# Patient Record
Sex: Female | Born: 1970 | Race: White | Hispanic: No | Smoking: Never smoker
Health system: Southern US, Community
[De-identification: ages and names within clinical notes are randomized; demographics above are authoritative.]

## PROBLEM LIST (undated history)

## (undated) DIAGNOSIS — G43901 Migraine, unspecified, not intractable, with status migrainosus: Secondary | ICD-10-CM

## (undated) DIAGNOSIS — E785 Hyperlipidemia, unspecified: Secondary | ICD-10-CM

## (undated) DIAGNOSIS — G43909 Migraine, unspecified, not intractable, without status migrainosus: Secondary | ICD-10-CM

## (undated) DIAGNOSIS — K219 Gastro-esophageal reflux disease without esophagitis: Secondary | ICD-10-CM

## (undated) DIAGNOSIS — R7989 Other specified abnormal findings of blood chemistry: Secondary | ICD-10-CM

## (undated) DIAGNOSIS — T7840XA Allergy, unspecified, initial encounter: Secondary | ICD-10-CM

## (undated) DIAGNOSIS — J45909 Unspecified asthma, uncomplicated: Secondary | ICD-10-CM

## (undated) DIAGNOSIS — R011 Cardiac murmur, unspecified: Secondary | ICD-10-CM

## (undated) DIAGNOSIS — G930 Cerebral cysts: Secondary | ICD-10-CM

## (undated) DIAGNOSIS — I10 Essential (primary) hypertension: Secondary | ICD-10-CM

## (undated) HISTORY — DX: Gastro-esophageal reflux disease without esophagitis: K21.9

## (undated) HISTORY — DX: Cerebral cysts: G93.0

## (undated) HISTORY — DX: Hyperlipidemia, unspecified: E78.5

## (undated) HISTORY — DX: Migraine, unspecified, not intractable, with status migrainosus: G43.901

## (undated) HISTORY — PX: WISDOM TOOTH EXTRACTION: SHX21

## (undated) HISTORY — DX: Cardiac murmur, unspecified: R01.1

## (undated) HISTORY — DX: Migraine, unspecified, not intractable, without status migrainosus: G43.909

## (undated) HISTORY — DX: Allergy, unspecified, initial encounter: T78.40XA

## (undated) HISTORY — PX: OTHER SURGICAL HISTORY: SHX169

## (undated) HISTORY — DX: Essential (primary) hypertension: I10

---

## 2005-03-08 ENCOUNTER — Other Ambulatory Visit: Admission: RE | Admit: 2005-03-08 | Discharge: 2005-03-08 | Payer: Self-pay | Admitting: Obstetrics and Gynecology

## 2007-11-07 ENCOUNTER — Ambulatory Visit: Payer: Self-pay | Admitting: Obstetrics & Gynecology

## 2007-11-07 ENCOUNTER — Inpatient Hospital Stay (HOSPITAL_COMMUNITY): Admission: AD | Admit: 2007-11-07 | Discharge: 2007-11-07 | Payer: Self-pay | Admitting: Obstetrics and Gynecology

## 2007-11-14 ENCOUNTER — Ambulatory Visit: Payer: Self-pay | Admitting: Obstetrics & Gynecology

## 2007-11-21 ENCOUNTER — Ambulatory Visit: Payer: Self-pay | Admitting: Obstetrics and Gynecology

## 2007-11-28 ENCOUNTER — Ambulatory Visit: Payer: Self-pay | Admitting: Gynecology

## 2007-12-05 ENCOUNTER — Ambulatory Visit: Payer: Self-pay | Admitting: Obstetrics & Gynecology

## 2007-12-11 ENCOUNTER — Ambulatory Visit: Payer: Self-pay | Admitting: Family Medicine

## 2007-12-13 ENCOUNTER — Encounter (INDEPENDENT_AMBULATORY_CARE_PROVIDER_SITE_OTHER): Payer: Self-pay | Admitting: Obstetrics and Gynecology

## 2007-12-13 ENCOUNTER — Inpatient Hospital Stay (HOSPITAL_COMMUNITY): Admission: AD | Admit: 2007-12-13 | Discharge: 2007-12-16 | Payer: Self-pay | Admitting: Obstetrics and Gynecology

## 2011-03-01 NOTE — Op Note (Signed)
[name], [name]               ACCOUNT NO.:  [id number]   MEDICAL RECORD NO.:  [id number]          PATIENT TYPE:  INP   LOCATION:  9122                          FACILITY:  [facility]   PHYSICIAN:  Guy Sandifer. Henderson Cloud, M.D. DATE OF BIRTH:  03-Feb-1971   DATE OF PROCEDURE:  12/13/2007  DATE OF DISCHARGE:                               OPERATIVE REPORT   PREOPERATIVE DIAGNOSIS:  1. Twin intrauterine gestation at the 36 and 6/7 weeks.  2. Arrest of cervical dilation.   POSTOPERATIVE DIAGNOSIS:  1. Twin intrauterine gestation at the 33 and 6/7 weeks.  2. Arrest of cervical dilation.   PROCEDURE:  Low transverse cesarean section.   SURGEON:  Guy Sandifer. Henderson Cloud, M.D.   ANESTHESIA:  Epidural, Leilani Able, M.D.   SPECIMENS:  Placenta to pathology.   ESTIMATED BLOOD LOSS:  900 mL.   FINDINGS:  Baby A viable female infant, Apgars of 8/9 at 1 and 5 minutes  respectively.  Birth weight 5 pounds 6 ounces.  Arterial cord pH 7.30.  Baby B viable female infant, Apgars of 9 and 9 at 1 and 5 minutes  respectively.  Birth weight 6 pounds 3 ounces.  Arterial cord pH 7.34.   INDICATIONS AND CONSENT:  The patient is a 40 year old white female G1,  P0, at 73 and 6/7 weeks estimated gestational age.  Her pregnancy has  been complicated by twins.  Ultrasound on December 06, 2007, revealed  both babies to be vertex.  The patient complains of uterine  contractions.  The cervix was changing to examination in the office.  She denied rupture of membranes, central nervous system changes, or  epigastric pain.  She is admitted to hospital.  Her cervix was 3 cm  dilated, 90% effaced, -1 to -2 station.  Both heartbeats were reactive.  Artificial rupture of membranes for clear fluid is carried out.  The  patient was found to have platelets of 110,000 on admission.  The  remainder of PIH panel was obtained and was within normal limits.  Repeat CBC revealed platelets of 120,000.  Pitocin augmentation was  started.   After 3-4 hours of good uterine contractions, the cervix was  without change.  A diagnosis of arrest of dilation was made.  The  recommendation for cesarean section is made.  The potential risks and  complications are discussed with the patient and father of the baby  preoperatively including but limited to infection, organ damage,  bleeding and transfusion, and DVT.  All questions were answered and  consent is signed on the chart.   PROCEDURE IN DETAIL:  The patient is taken to the operating room where  she is identified.  Her epidural is augmented to surgical level.  She is  placed in a dorsal supine position with a 15 degrees left lateral wedge.  A Foley catheter was already in place.  She was prepped and draped in a  sterile fashion.  After testing for adequate epidural anesthesia, the  skin is entered through a Pfannenstiel incision and dissection is  carried out in layers to the peritoneum which was incised and extended  superiorly and inferiorly.  The vesicouterine peritoneum was taken down  cephalolaterally.  The bladder flap was developed and a bladder blade  was placed.  The uterus is incised in a low transverse manner and the  uterine cavity is entered bluntly with a hemostat.  The uterine incision  extended cephalolaterally with the fingers.  The vertex of baby A is  then delivered without difficulty.  The oral and nasopharynx were  suctioned.  The remainder of the baby is delivered.  Good cry and tone  is noted.  The cord is clamped and cut and the baby is handed to the  awaiting pediatrics team.  Artificial rupture of membranes for baby B is  carried out for clear fluid.  The vertex is then delivered without  difficulty.  Oral and nasopharynx were suctioned.  The remainder of the  baby is delivered and good cry and tone is noted.  The cord is clamped  and cut and the baby is handed to the awaiting pediatrics team.  The  cord for baby A is marked with a cord clamp.  The  placentae are manually  delivered and sent to pathology.  The uterine cavity is cleaned.  The  uterus is closed in two running locking imbricating layers of 0 Monocryl  suture.  There is a 3 cm intramural fibroid in the left lower uterine  segment lateral to the left angle of the uterine incision.  Good  hemostasis is obtained.  The tubes and ovaries were normal bilaterally.  The anterior peritoneum was closed in running fashion with 0 Monocryl  suture which was also used to reapproximate the pyramidalis muscle in  the midline.  The anterior rectus fascia is closed in a running fashion  with 0 PDS suture and the skin is closed with clips.  All sponge,  instrument, and needle counts were correct.  The patient was transferred  to the recovery room in stable condition.      Guy Sandifer Henderson Cloud, M.D.  Electronically Signed     JET/MEDQ  D:  12/14/2007  T:  12/14/2007  Job:  [id number]

## 2011-03-04 NOTE — Discharge Summary (Signed)
[name], [name]               ACCOUNT NO.:  [id number]   MEDICAL RECORD NO.:  [id number]           PATIENT TYPE:   LOCATION:                                 FACILITY:   PHYSICIAN:  Duke Salvia. Marcelle Overlie, M.D.DATE OF BIRTH:  [birth year]   DATE OF ADMISSION:  12/13/2007  DATE OF DISCHARGE:  12/16/2007                               DISCHARGE SUMMARY   ADMITTING DIAGNOSES:  1. Intrauterine pregnancy at 36-6/7 weeks, estimated gestational age.  2. Twin gestation, very spontaneous onset of labor.   DISCHARGE DIAGNOSES:  1. Status post low-transverse cesarean section.  2. Viable female and female infant.   PROCEDURE:  Primary low-transverse cesarean section.   REASON FOR ADMISSION:  Please see written H&P.   HOSPITAL COURSE:  The patient is a 40 year old white female,  primigravida that was admitted to [facility] at 36-6/7  weeks estimated gestational age.  Her pregnancy had been complicated by  twin gestation.  The patient had had an ultrasound, and both babies were  in the vertex presentation.  On admission, the patient had had  spontaneous onset of labor and cervix was seemed to be changing.  She  denied rupture of membranes, headache, blurred vision, or right upper  quadrant pain.  The patient had been noted to be 3 cm dilated, 90%  effaced with vertex at -1 and -2 station.  Fetal heart tones were  reactive.  On admission, laboratory findings revealed platelet count of  110,000.  PIH labs were drawn, which were within normal limits.  CBC was  repeated and platelets were up to 120,000.  Artificial rupture of  membranes was performed, which revealed clear fluid.  Pitocin  augmentation was started after 3 to 4 hours of good contractions.  Cervix was without change.  Decision was made to proceed with a primary  low-transverse cesarean section.  The patient was then transferred to  the operating room, where epidural was dosed to an adequate surgical  level.  A  low-transverse incision was made; delivery of baby A, a viable  female infant, weighing 5 pounds 6 ounces with Apgars of 8 at 1 minute  and 9 at 5 minutes.  Arterial cord pH of 7.30.  Baby B, a female infant,  weighing 6 pounds 3 ounces was delivered with Apgars of 9 at 1 minute  and 9 at 5 minutes.  Arterial cord pH of 7.34.  The patient tolerated  procedure well and was taken to the recovery room in stable condition.  On postoperative day #1, the patient denied headache, blurred vision, or  right upper quadrant pain.  Vital signs were stable with blood pressure  142/85 to 142/87.  Deep tendon reflexes were 1+.  No clonus.  No pitting  edema was noted.  Abdomen was soft.  Fundus firm and nontender.  Abdominal dressing was noted to be clean, dry, and intact.  Foley was  noted to have adequate amount of urine output.   Laboratory findings revealed hemoglobin of 9.1, platelet count of  91,000, and WBC count of 13.1.  Liver function tests were AST was  25,  ALT was 11, alkaline phosphatase was 241, and uric acid was 7.5.  PIH  labs were ordered for later in the afternoon, the patient was thus  started on a regular diet and Motrin was discontinued.   On postoperative day #2, the patient was without complaint.  Vital signs  were stable.  Blood pressure was 119/70.  Temperature 98.9.  Incision  was clean, dry, and intact.  Repeat laboratories revealed hemoglobin of  8.9, platelet count of 89,000, WBC count of 12,000.  On postoperative  day #3, the patient was without complaint.  Vital signs remained stable.  She is afebrile.  Fundus firm and nontender.  Incision was clean, dry,  and intact.  Staples were removed.  Repeat laboratory findings revealed  hemoglobin of 9.2 and  platelet count was up to 113,000.   Discharge instructions were reviewed, and the patient was later  discharged to home.   CONDITION ON DISCHARGE:  Stable.   DIET:  Regular as tolerated.   ACTIVITY:  No heavy lifting, no  driving x2 weeks, and no vaginal entry.   FOLLOW UP:  The patient to follow up in the office in 2 to 3 days for an  incision check, CBC, and blood pressure check.  She is to call for  temperature greater than 100 degrees, persistent nausea, vomiting, heavy  vaginal bleeding, and redness or drainage from incisional site.  The  patient was also instructed call for headache, blurred vision, or right  upper quadrant pain.   DISCHARGE MEDICATIONS:  1. Percocet 5/325, #30, one p.o. 4 to 6 hours.  2. Motrin 600 mg every 6 hours.  3. Prenatal vitamins 1 p.o. daily.  4. Colace 1 p.o. daily.      Julio Sicks, N.P.      Richard M. Marcelle Overlie, M.D.  Electronically Signed    CC/MEDQ  D:  02/04/2008  T:  02/05/2008  Job:  [id number]

## 2011-07-07 LAB — URINALYSIS, ROUTINE W REFLEX MICROSCOPIC
Nitrite: NEGATIVE
Protein, ur: NEGATIVE
Specific Gravity, Urine: 1.015
Urobilinogen, UA: 0.2

## 2011-07-08 LAB — CBC
HCT: 26.3 — ABNORMAL LOW
HCT: 26.7 — ABNORMAL LOW
Hemoglobin: 11.7 — ABNORMAL LOW
Hemoglobin: 8.9 — ABNORMAL LOW
Hemoglobin: 9.3 — ABNORMAL LOW
Hemoglobin: 9.4 — ABNORMAL LOW
MCHC: 34.7
MCV: 92.9
Platelets: 110 — ABNORMAL LOW
RBC: 2.68 — ABNORMAL LOW
RBC: 3.53 — ABNORMAL LOW
RDW: 12.4
RDW: 12.7
RDW: 13
WBC: 11.4 — ABNORMAL HIGH
WBC: 13.6 — ABNORMAL HIGH

## 2011-07-08 LAB — COMPREHENSIVE METABOLIC PANEL
ALT: 12
AST: 27
Albumin: 2.6 — ABNORMAL LOW
Alkaline Phosphatase: 150 — ABNORMAL HIGH
Alkaline Phosphatase: 241 — ABNORMAL HIGH
BUN: 11
BUN: 5 — ABNORMAL LOW
CO2: 29
Calcium: 8.6
Creatinine, Ser: 0.75
GFR calc Af Amer: >60
GFR calc non Af Amer: >60
Glucose, Bld: 137 — ABNORMAL HIGH
Glucose, Bld: 82
Potassium: 4
Potassium: 4.3
Sodium: 132 — ABNORMAL LOW
Sodium: 140
Total Protein: 4.2 — ABNORMAL LOW
Total Protein: 4.9 — ABNORMAL LOW
Total Protein: 5.4 — ABNORMAL LOW

## 2011-07-08 LAB — DIFFERENTIAL
Basophils Relative: 0
Eosinophils Absolute: 0
Eosinophils Relative: 0
Monocytes Relative: 7
Neutrophils Relative %: 78 — ABNORMAL HIGH

## 2011-07-08 LAB — URIC ACID
Uric Acid, Serum: 7.1 — ABNORMAL HIGH
Uric Acid, Serum: 7.2 — ABNORMAL HIGH
Uric Acid, Serum: 7.5 — ABNORMAL HIGH

## 2011-07-08 LAB — RPR: RPR Ser Ql: NONREACTIVE

## 2011-07-08 LAB — LACTATE DEHYDROGENASE: LDH: 235

## 2011-07-11 LAB — CBC
HCT: 26.1 — ABNORMAL LOW
MCHC: 35.1
Platelets: 113 — ABNORMAL LOW
RDW: 13.3

## 2011-08-12 ENCOUNTER — Ambulatory Visit
Admission: RE | Admit: 2011-08-12 | Discharge: 2011-08-12 | Disposition: A | Discharge status: Home / Self Care | Payer: BC Managed Care – PPO | Source: Ambulatory Visit | Attending: Allergy | Admitting: Allergy

## 2011-08-12 ENCOUNTER — Other Ambulatory Visit: Payer: Self-pay | Admitting: Allergy

## 2011-08-12 DIAGNOSIS — J209 Acute bronchitis, unspecified: Secondary | ICD-10-CM

## 2012-02-29 ENCOUNTER — Other Ambulatory Visit: Payer: Self-pay | Admitting: Obstetrics and Gynecology

## 2012-08-14 ENCOUNTER — Other Ambulatory Visit: Payer: Self-pay | Admitting: Family Medicine

## 2012-08-14 DIAGNOSIS — N644 Mastodynia: Secondary | ICD-10-CM

## 2012-08-30 ENCOUNTER — Other Ambulatory Visit: Payer: Self-pay | Admitting: Family Medicine

## 2012-08-30 ENCOUNTER — Ambulatory Visit
Admission: RE | Admit: 2012-08-30 | Discharge: 2012-08-30 | Disposition: A | Discharge status: Home / Self Care | Payer: BC Managed Care – PPO | Source: Ambulatory Visit | Attending: Family Medicine | Admitting: Family Medicine

## 2012-08-30 DIAGNOSIS — N644 Mastodynia: Secondary | ICD-10-CM

## 2012-09-02 ENCOUNTER — Emergency Department (INDEPENDENT_AMBULATORY_CARE_PROVIDER_SITE_OTHER): Payer: BC Managed Care – PPO

## 2012-09-02 ENCOUNTER — Emergency Department (HOSPITAL_COMMUNITY)
Admission: EM | Admit: 2012-09-02 | Discharge: 2012-09-02 | Disposition: A | Discharge status: Home / Self Care | Payer: BC Managed Care – PPO | Source: Home / Self Care

## 2012-09-02 ENCOUNTER — Encounter (HOSPITAL_COMMUNITY): Payer: Self-pay | Admitting: Emergency Medicine

## 2012-09-02 DIAGNOSIS — S93401A Sprain of unspecified ligament of right ankle, initial encounter: Secondary | ICD-10-CM

## 2012-09-02 DIAGNOSIS — S93409A Sprain of unspecified ligament of unspecified ankle, initial encounter: Secondary | ICD-10-CM

## 2012-09-02 HISTORY — DX: Other specified abnormal findings of blood chemistry: R79.89

## 2012-09-02 HISTORY — DX: Unspecified asthma, uncomplicated: J45.909

## 2012-09-02 MED ORDER — HYDROCODONE-ACETAMINOPHEN 5-325 MG PO TABS: 2.0000 | ORAL_TABLET | ORAL | Status: AC | PRN

## 2012-09-02 NOTE — ED Notes (Signed)
Pt states that around 3 p.m today she was chasing after her soon and fell off ladder to bouncy house landing with right foot turned inward. Right ankle swollen and unable to bear weight.

## 2012-09-02 NOTE — ED Provider Notes (Signed)
Medical screening examination/treatment/procedure(s) were performed by non-physician practitioner and as supervising physician I was immediately available for consultation/collaboration.  Leslee Home, M.D.   Reuben Likes, MD 09/02/12 [phone]

## 2012-09-02 NOTE — ED Provider Notes (Signed)
History     CSN: [id number]  Arrival date & time 09/02/12  1645   None     Chief Complaint  Patient presents with  . Ankle Injury    fell over ladder to blow up house landing with right foot turning inward. swelling noted    (Consider location/radiation/quality/duration/timing/severity/associated sxs/prior treatment) Patient is a 41 y.o. female presenting with ankle pain. The history is provided by the patient. No language interpreter was used.  Ankle Pain  The incident occurred less than 1 hour ago[place] The injury mechanism was a direct blow and torsion. The pain is present in the right ankle. The quality of the pain is described as aching and throbbing. The pain is at a severity of 5/10. The pain is moderate. The pain has been worsening since onset. Associated symptoms include inability to bear weight. Pertinent negatives include no numbness. She reports no foreign bodies present. She has tried nothing for the symptoms. The treatment provided no relief.    Past Medical History  Diagnosis Date  . Cholesterol blood decreased     high cholesterol  . Asthma     Past Surgical History  Procedure Date  . Cesarean section   . Wisdom tooth extraction     History reviewed. No pertinent family history.  History  Substance Use Topics  . Smoking status: Never Smoker   . Smokeless tobacco: Not on file  . Alcohol Use: No    OB History    Grav Para Term Preterm Abortions TAB SAB Ect Mult Living                  Review of Systems  Musculoskeletal: Positive for myalgias, joint swelling and gait problem.  Neurological: Negative for numbness.  All other systems reviewed and are negative.    Allergies  Erythromycin  Home Medications   Current Outpatient Rx  Name  Route  Sig  Dispense  Refill  . CHOLESTEROL RELIEF PO   Oral   Take by mouth.         Janetta Hora ESTRADIOL 0.4-35 MG-MCG PO TABS   Oral   Take 1 tablet by mouth  daily.           BP 143/93  Pulse 108  Temp 100.1 F (37.8 C) (Oral)  Resp 20  SpO2 100%  Physical Exam  Nursing note and vitals reviewed. Constitutional: She is oriented to person, place, and time. She appears well-developed and well-nourished.  HENT:  Head: Normocephalic and atraumatic.  Musculoskeletal: She exhibits tenderness.       Swollen tender right ankle.  Decreased range of motion,  nv and ns intact  Neurological: She is alert and oriented to person, place, and time. She has normal reflexes.  Skin: Skin is warm.  Psychiatric: She has a normal mood and affect.    ED Course  Procedures (including critical care time)  Labs Reviewed - No data to display Dg Ankle Complete Right  09/02/2012  *RADIOLOGY REPORT*  Clinical Data: Right ankle injury, fall, lateral pain and swelling  RIGHT ANKLE - COMPLETE 3+ VIEW  Comparison: None  Findings: Significant lateral and anterior soft tissue swelling. Osseous mineralization normal. Ankle mortise intact. Tiny plantar calcaneal spur. No acute fracture, dislocation or bone destruction.  IMPRESSION: No acute osseous abnormalities.   Original Report Authenticated By: Ulyses Southward, M.D.      1. Sprain of ankle, right       MDM  Cam walker  and crutches,  Hydrocodone,  Follow up with Dr. Lajoyce Corners for recheck in 3-4 days        Lonia Skinner [place], [place] 09/02/12 1739  Lonia Skinner [place], [place] 09/02/12 1747

## 2013-01-07 ENCOUNTER — Other Ambulatory Visit: Payer: Self-pay | Admitting: Neurology

## 2013-03-05 ENCOUNTER — Other Ambulatory Visit: Payer: Self-pay | Admitting: Obstetrics and Gynecology

## 2013-09-06 ENCOUNTER — Other Ambulatory Visit: Payer: Self-pay

## 2013-09-06 DIAGNOSIS — Z1231 Encounter for screening mammogram for malignant neoplasm of breast: Secondary | ICD-10-CM

## 2013-09-10 ENCOUNTER — Other Ambulatory Visit: Payer: Self-pay | Admitting: Neurology

## 2013-10-04 ENCOUNTER — Other Ambulatory Visit: Payer: Self-pay | Admitting: Family Medicine

## 2013-10-04 ENCOUNTER — Ambulatory Visit
Admission: RE | Admit: 2013-10-04 | Discharge: 2013-10-04 | Disposition: A | Discharge status: Home / Self Care | Payer: BC Managed Care – PPO | Source: Ambulatory Visit

## 2013-10-04 DIAGNOSIS — Z1231 Encounter for screening mammogram for malignant neoplasm of breast: Secondary | ICD-10-CM

## 2013-10-16 ENCOUNTER — Other Ambulatory Visit: Payer: Self-pay | Admitting: Obstetrics and Gynecology

## 2013-10-16 DIAGNOSIS — R928 Other abnormal and inconclusive findings on diagnostic imaging of breast: Secondary | ICD-10-CM

## 2013-10-24 ENCOUNTER — Ambulatory Visit
Admission: RE | Admit: 2013-10-24 | Discharge: 2013-10-24 | Disposition: A | Discharge status: Home / Self Care | Payer: BC Managed Care – PPO | Source: Ambulatory Visit | Attending: Obstetrics and Gynecology | Admitting: Obstetrics and Gynecology

## 2013-10-24 DIAGNOSIS — R928 Other abnormal and inconclusive findings on diagnostic imaging of breast: Secondary | ICD-10-CM

## 2013-11-18 ENCOUNTER — Telehealth: Payer: Self-pay | Admitting: *Deleted

## 2013-11-18 MED ORDER — SUMATRIPTAN SUCCINATE 50 MG PO TABS: 50.0000 mg | ORAL_TABLET | ORAL | Status: DC | PRN

## 2013-11-18 NOTE — Telephone Encounter (Signed)
[facility]

## 2013-11-18 NOTE — Telephone Encounter (Signed)
Patient has an appt scheduled in August.  Refills have been sent to last until she is seen.

## 2014-03-21 ENCOUNTER — Other Ambulatory Visit: Payer: Self-pay | Admitting: Obstetrics and Gynecology

## 2014-03-21 DIAGNOSIS — N63 Unspecified lump in unspecified breast: Secondary | ICD-10-CM

## 2014-04-17 ENCOUNTER — Other Ambulatory Visit: Payer: Self-pay | Admitting: Obstetrics and Gynecology

## 2014-04-21 LAB — CYTOLOGY - PAP

## 2014-04-24 ENCOUNTER — Encounter (INDEPENDENT_AMBULATORY_CARE_PROVIDER_SITE_OTHER): Payer: Self-pay

## 2014-04-24 ENCOUNTER — Ambulatory Visit
Admission: RE | Admit: 2014-04-24 | Discharge: 2014-04-24 | Disposition: A | Discharge status: Home / Self Care | Payer: BC Managed Care – PPO | Source: Ambulatory Visit | Attending: Obstetrics and Gynecology | Admitting: Obstetrics and Gynecology

## 2014-04-24 DIAGNOSIS — N63 Unspecified lump in unspecified breast: Secondary | ICD-10-CM

## 2014-06-02 ENCOUNTER — Encounter: Payer: Self-pay | Admitting: *Deleted

## 2014-06-05 ENCOUNTER — Ambulatory Visit (INDEPENDENT_AMBULATORY_CARE_PROVIDER_SITE_OTHER): Payer: BC Managed Care – PPO | Admitting: Neurology

## 2014-06-05 ENCOUNTER — Encounter: Payer: Self-pay | Admitting: Neurology

## 2014-06-05 VITALS — BP 141/92 | HR 69 | Resp 16 | Ht 62.0 in | Wt 142.0 lb

## 2014-06-05 DIAGNOSIS — G43001 Migraine without aura, not intractable, with status migrainosus: Secondary | ICD-10-CM

## 2014-06-05 DIAGNOSIS — G43901 Migraine, unspecified, not intractable, with status migrainosus: Secondary | ICD-10-CM | POA: Insufficient documentation

## 2014-06-05 HISTORY — DX: Migraine, unspecified, not intractable, with status migrainosus: G43.901

## 2014-06-05 MED ORDER — SUMATRIPTAN SUCCINATE 50 MG PO TABS: 50.0000 mg | ORAL_TABLET | ORAL | Status: DC | PRN

## 2014-06-05 NOTE — Patient Instructions (Signed)

## 2014-06-05 NOTE — Progress Notes (Signed)
Provider:  Melvyn Novasarmen  Maya Arcand, M D  Referring Provider: No ref. provider found Primary Care Physician:  [name],[name], MD  Chief Complaint  Patient presents with  . Follow-up    Room 11  . Migraine    HPI:  [name] is a 43 y.o. female , who is seen here as a yearly revisit  from Dr. Paulino RilyWolters,  The patient has been diagnosed with migraines already many years ago and has [facility] primarily. She has a strong family trait in her paternal family, her father, paternal uncles and paternal cousins have migraines.  She tried all preventive medications including beta blockers topiramate and did not achieve the protective effect. One trigger , that we'll give her a migraine is drinking alcohol.  She had about 9-10 migraines per month before her twins were born and after that  about 4-5 per month.  The initial 2 years after the birth of her children were headache free,  she is no longer taking birth control  pills and seems to get her migraines now mostly during the placebo week with breakthrough menstruation.  When I followed the patient in every visit she was able to stop the preventive medications and I refilled Imitrex for her on  03-28-12.  Her current headache frequency is named at 2-3 events per month- usual duration of the migraine headache is 1-3 days , she has a neck tension component and takes ibuprofen- if this fails she advances to imitrex po 50 mg. Associated are nausea, tiredness, not as much phono- or photophobia. .     Review of Systems: Out of a complete 14 system review, the patient complains of only the following symptoms, and all other reviewed systems are negative.   History   Social History  . Marital Status: Married    Spouse Name: [name]    Number of Children: 2  . Years of Education: College   Occupational History  .     Social History Main Topics  . Smoking status: Never Smoker   . Smokeless tobacco:  Never Used  . Alcohol Use: No  . Drug Use: No  . Sexual Activity: Not on file   Other Topics Concern  . Not on file   Social History Narrative   Patient is married ([name]) and lives at home with her husband and twins (boy and a girl).   Patient is a Ambulance personT recruiter.   Patient drinks one cup of coffee daily.   Patient has a college education.   Patient is right-handed.    Family History  Problem Relation Age of Onset  . Breast cancer Maternal Grandmother   . Breast cancer Paternal Grandfather   . Prostate cancer Maternal Grandfather   . Heart attack    . Heart attack    . Tremor Mother   . Fibromyalgia Mother     Past Medical History  Diagnosis Date  . Cholesterol blood decreased     high cholesterol  . Asthma   . Migraine headache   . Heart murmur   . Cyst of brain     penal gland    Past Surgical History  Procedure Laterality Date  . Cesarean section  2009    (Twins- fraternal)  . Wisdom tooth extraction    . Mole removed      1989    Current Outpatient Prescriptions  Medication Sig Dispense Refill  . atorvastatin (LIPITOR) 10 MG tablet 1 tablet daily.      .[name]  levocetirizine (XYZAL) 5 MG tablet 1 tablet daily.      . norethindrone-ethinyl estradiol (OVCON-35,BALZIVA,BRIELLYN) 0.4-35 MG-MCG tablet Take 1 tablet by mouth daily.      . SUMAtriptan (IMITREX) 50 MG tablet Take 1 tablet (50 mg total) by mouth as needed for migraine. May repeat in 2 hours if headache persists or recurs.  8 tablet  6   No current facility-administered medications for this visit.    Allergies as of 06/05/2014 - Review Complete 06/05/2014  Allergen Reaction Noted  . Erythromycin  09/02/2012    Vitals: BP 141/92  Pulse 69  Resp 16  Ht 5\' 2"  (1.575 m)  Wt 142 lb (64.411 kg)  BMI 25.97 kg/m2 Last Weight:  Wt Readings from Last 1 Encounters:  06/05/14 142 lb (64.411 kg)   Last Height:   Ht Readings from Last 1 Encounters:  06/05/14 5\' 2"  (1.575 m)    Physical  exam:  General: The patient is awake, alert and appears not in acute distress. The patient is well groomed. Head: Normocephalic, atraumatic. Neck is supple. Mallampati 1, neck circumference:14 Cardiovascular:  Regular rate and rhythm , without  murmurs or carotid bruit, and without distended neck veins. Respiratory: Lungs are clear to auscultation. Skin:  Without evidence of edema, or rash Trunk: normal posture.  Neurologic exam : The patient is awake and alert, oriented to place and time.  Memory subjective  described as intact.  There is a normal attention span & concentration ability. Speech is fluent without  dysarthria, dysphonia or aphasia. Mood and affect are appropriate.  Cranial nerves: Pupils are equal and briskly reactive to light. Extraocular movements  in vertical and horizontal planes intact and with endpoint nystagmus.  Visual fields by finger perimetry are intact. Hearing to finger rub intact.  Facial sensation intact to fine touch. Facial motor strength is symmetric and tongue and uvula move midline.  Tongue protrusion into either cheek is normal. Shoulder shrug is normal.  Motor exam:  Normal strength in all extremities. Sensory:   normal. Coordination: l without evidence of ataxia, dysmetria or tremor. Gait and station: Patient walks without assistive device and is able unassisted to climb up to the exam table. Strength within normal limits. Stance is stable and normal. Deep tendon reflexes: in the  upper and lower extremities are symmetric and intact.     Assessment:  After physical and neurologic examination, review of laboratory studies, imaging, neurophysiology testing and pre-existing records, assessment is that of :  Migraine , controlled with imitrex 50 mg as needed.   Plan:  Treatment plan and additional workup : refill  Rv in 12 month with Butch Penny, NP .      Porfirio Mylar Junia Nygren MD 06/05/2014

## 2014-10-02 ENCOUNTER — Other Ambulatory Visit: Payer: Self-pay | Admitting: Obstetrics and Gynecology

## 2014-10-02 DIAGNOSIS — N6320 Unspecified lump in the left breast, unspecified quadrant: Secondary | ICD-10-CM

## 2014-10-22 ENCOUNTER — Ambulatory Visit
Admission: RE | Admit: 2014-10-22 | Discharge: 2014-10-22 | Disposition: A | Discharge status: Home / Self Care | Payer: BLUE CROSS/BLUE SHIELD | Source: Ambulatory Visit | Attending: Obstetrics and Gynecology | Admitting: Obstetrics and Gynecology

## 2014-10-22 DIAGNOSIS — N6320 Unspecified lump in the left breast, unspecified quadrant: Secondary | ICD-10-CM

## 2014-11-19 ENCOUNTER — Other Ambulatory Visit: Payer: Self-pay | Admitting: Neurology

## 2015-05-13 ENCOUNTER — Other Ambulatory Visit: Payer: Self-pay | Admitting: Obstetrics and Gynecology

## 2015-05-14 LAB — CYTOLOGY - PAP

## 2015-06-09 ENCOUNTER — Ambulatory Visit: Payer: BC Managed Care – PPO | Admitting: Adult Health

## 2015-06-10 ENCOUNTER — Ambulatory Visit (INDEPENDENT_AMBULATORY_CARE_PROVIDER_SITE_OTHER): Payer: BLUE CROSS/BLUE SHIELD | Admitting: Adult Health

## 2015-06-10 ENCOUNTER — Encounter: Payer: Self-pay | Admitting: Adult Health

## 2015-06-10 VITALS — BP 120/80 | HR 79 | Ht 62.0 in | Wt 140.0 lb

## 2015-06-10 DIAGNOSIS — G43009 Migraine without aura, not intractable, without status migrainosus: Secondary | ICD-10-CM | POA: Diagnosis not present

## 2015-06-10 MED ORDER — SUMATRIPTAN SUCCINATE 50 MG PO TABS: ORAL_TABLET | ORAL | Status: DC

## 2015-06-10 NOTE — Progress Notes (Signed)
I agree with the assessment and plan as directed by NP .The patient is known to me .   Anali Cabanilla, MD  

## 2015-06-10 NOTE — Patient Instructions (Signed)
Continue Imitrex If your symptoms worsen or you develop new symptoms please let us know.   

## 2015-06-10 NOTE — Progress Notes (Signed)
PATIENT: [name] DOB: 1971/06/05  REASON FOR VISIT: follow up- migraine headaches HISTORY FROM: patient  HISTORY OF PRESENT ILLNESS: Ms. [name] is a 44 year old female with a history of migraine headaches. She returns today for follow-up. The patient is currently not taking any preventative medications. She only uses Imitrex as needed to treat her migraines. She reports that she currently only has 2-3 migraine headaches a month. She states that some months it  may not even be this many. She does state that her last migraines were due to being overheated. She also knows that stress is a trigger for her migraines. She has been using essential oil on the back of her neck and shoulders to help with muscle tension and headaches. She also takes B complex vitamin. She states that when she does get a headache it normally starts on the right side at the base of the neck and travels to the temporal area. She denies nausea and vomiting, photophobia and phonophobia with her migraines. She states that smells do bother her. She states that when she does get a headache she can  take Imitrex and that resolves her discomfort. She denies any new medical issues. She returns today for an evaluation.    HISTORY 06/05/14 [name]): [name] is a 44 y.o. female , who is seen here as a yearly revisit from Dr. Paulino Park. The patient has been diagnosed with migraines already many years ago and [facility]. She has a strong family trait in her paternal family, her father, paternal uncles and paternal cousins have migraines.  She tried all preventive medications including beta blockers topiramate and did not achieve the protective effect. One trigger , that we'll give her a migraine is drinking alcohol. She had about 9-10 migraines per month before her twins were born and after that about 4-5 per month. The initial 2 years after the  birth of her children were headache free, she is no longer taking birth control pills and seems to get her migraines now mostly during the placebo week with breakthrough menstruation.  When I followed the patient in every visit she was able to stop the preventive medications and I refilled Imitrex for her on 03-28-12. Her current headache frequency is named at 2-3 events per month- usual duration of the migraine headache is 1-3 days , she has a neck tension component and takes ibuprofen- if this fails she advances to imitrex po 50 mg. Associated are nausea, tiredness, not as much phono- or photophobia. [name]    REVIEW OF SYSTEMS: Out of a complete 14 system review of symptoms, the patient complains only of the following symptoms, and all other reviewed systems are negative.  Murmur  ALLERGIES: Allergies  Allergen Reactions  . Erythromycin     HOME MEDICATIONS: Outpatient Prescriptions Prior to Visit  Medication Sig Dispense Refill  . norethindrone-ethinyl estradiol (OVCON-35,BALZIVA,BRIELLYN) 0.4-35 MG-MCG tablet Take 1 tablet by mouth daily.    . SUMAtriptan (IMITREX) 50 MG tablet Take 1 tablet (50 mg total) by mouth as needed for migraine. May repeat in 2 hours if headache persists or recurs. 8 tablet 6  . SUMAtriptan (IMITREX) 50 MG tablet TAKE 1 TABLET BY MOUTH AS NEEDED FOR MIGRAINE, MAY REPEAT IN 2HRS IF HEADACE PERSISTS OR RECURS 8 tablet 6  . atorvastatin (LIPITOR) 10 MG tablet 1 tablet daily.    [name] levocetirizine (XYZAL) 5 MG tablet 1 tablet daily.     No facility-administered medications prior  to visit.    PAST MEDICAL HISTORY: Past Medical History  Diagnosis Date  . Cholesterol blood decreased     high cholesterol  . Asthma   . Migraine headache   . Heart murmur   . Cyst of brain     penal gland  . Migraine with status migrainosus 06/05/2014    PAST SURGICAL HISTORY: Past Surgical History  Procedure Laterality Date  . Cesarean section  2009    (Twins- fraternal)    . Wisdom tooth extraction    . Mole removed      1989    FAMILY HISTORY: Family History  Problem Relation Age of Onset  . Breast cancer Maternal Grandmother   . Breast cancer Paternal Grandfather   . Prostate cancer Maternal Grandfather   . Heart attack    . Heart attack    . Tremor Mother   . Fibromyalgia Mother     SOCIAL HISTORY: Social History   Social History  . Marital Status: Married    Spouse Name: Gabriel Rung  . Number of Children: 2  . Years of Education: College   Occupational History  .     Social History Main Topics  . Smoking status: Never Smoker   . Smokeless tobacco: Never Used  . Alcohol Use: No  . Drug Use: No  . Sexual Activity: Not on file   Other Topics Concern  . Not on file   Social History Narrative   Patient is married ([name]) and lives at home with her husband and twins (boy and a girl).   Patient is a Ambulance person.   Patient drinks one cup of coffee daily.   Patient has a college education.   Patient is right-handed.      PHYSICAL EXAM  Filed Vitals:   06/10/15 1127  BP: 120/80  Pulse: 79  Height: 5\' 2"  (1.575 m)  Weight: 140 lb (63.504 kg)   Body mass index is 25.6 kg/(m^2).  Generalized: Well developed, in no acute distress   Neurological examination  Mentation: Alert oriented to time, place, history taking. Follows all commands speech and language fluent Cranial nerve II-XII: Pupils were equal round reactive to light. Extraocular movements were full, visual field were full on confrontational test. Facial sensation and strength were normal. Uvula tongue midline. Head turning and shoulder shrug  were normal and symmetric. Motor: The motor testing reveals 5 over 5 strength of all 4 extremities. Good symmetric motor tone is noted throughout.  Sensory: Sensory testing is intact to soft touch on all 4 extremities. No evidence of extinction is noted.  Coordination: Cerebellar testing reveals good finger-nose-finger and heel-to-shin  bilaterally.  Gait and station: Gait is normal. Tandem gait is normal. Romberg is negative. No drift is seen.  Reflexes: Deep tendon reflexes are symmetric and normal bilaterally.   DIAGNOSTIC DATA (LABS, IMAGING, TESTING) - I reviewed patient records, labs, notes, testing and imaging myself where available.  ASSESSMENT AND PLAN 44 y.o. year old female  has a past medical history of Cholesterol blood decreased; Asthma; Migraine headache; Heart murmur; Cyst of brain; and Migraine with status migrainosus (06/05/2014). here with:  1. Migraine headaches  Overall the patient has remained stable. She will continue to use Imitrex as needed for migraine headaches If her migraine frequency increases she should let us know.  She will follow-up in one year or sooner if needed.   Butch Penny, MSN, NP-C 06/10/2015, 11:26 AM [facility] [address] [place], [place] [address] [phone]    

## 2015-12-07 ENCOUNTER — Other Ambulatory Visit: Payer: Self-pay

## 2015-12-07 DIAGNOSIS — Z1231 Encounter for screening mammogram for malignant neoplasm of breast: Secondary | ICD-10-CM

## 2015-12-31 ENCOUNTER — Ambulatory Visit
Admission: RE | Admit: 2015-12-31 | Discharge: 2015-12-31 | Disposition: A | Discharge status: Home / Self Care | Payer: BLUE CROSS/BLUE SHIELD | Source: Ambulatory Visit

## 2015-12-31 DIAGNOSIS — Z1231 Encounter for screening mammogram for malignant neoplasm of breast: Secondary | ICD-10-CM

## 2016-04-12 ENCOUNTER — Other Ambulatory Visit: Payer: Self-pay | Admitting: Neurology

## 2016-06-09 ENCOUNTER — Ambulatory Visit (INDEPENDENT_AMBULATORY_CARE_PROVIDER_SITE_OTHER): Payer: BLUE CROSS/BLUE SHIELD | Admitting: Adult Health

## 2016-06-09 ENCOUNTER — Encounter: Payer: Self-pay | Admitting: Adult Health

## 2016-06-09 VITALS — BP 145/88 | HR 76 | Ht 62.0 in | Wt 137.0 lb

## 2016-06-09 DIAGNOSIS — G43109 Migraine with aura, not intractable, without status migrainosus: Secondary | ICD-10-CM

## 2016-06-09 MED ORDER — SUMATRIPTAN SUCCINATE 50 MG PO TABS: ORAL_TABLET | ORAL | 11 refills | Status: DC

## 2016-06-09 NOTE — Progress Notes (Signed)
I agree with the assessment and plan as directed by NP .   Oumar Marcott, MD  

## 2016-06-09 NOTE — Patient Instructions (Signed)
Continue Imitrex If your symptoms worsen or you develop new symptoms please let us know.

## 2016-06-09 NOTE — Progress Notes (Signed)
PATIENT: [name] DOB: 04-18-1971  REASON FOR VISIT: follow up HISTORY FROM: patient  HISTORY OF PRESENT ILLNESS: Today 06/09/2016 : [name] is a 45 year old female with a history of migraine headaches. She returns today for follow-up. The patient continues to use Imitrex to treat her acute migraines. She has approximately 2-3 migraine headaches a month. She also states that birth control is beneficial for her headaches. She states that she recently switched to a new birth control. while she was not on any medication for one week she did have several migraines. She states that her headaches typically occur on the right side. She denies photophobia, phonophobia. Occasionally she will have nausea but no vomiting. She states that smell is a trigger for her headaches. She states currently she is satisfied with using only Imitrex. She returns today for an evaluation.  HISTORY 06/10/15 (MM):[name] is a 45 year old female with a history of migraine headaches. She returns today for follow-up. The patient is currently not taking any preventative medications. She only uses Imitrex as needed to treat her migraines. She reports that she currently only has 2-3 migraine headaches a month. She states that some months it  may not even be this many. She does state that her last migraines were due to being overheated. She also knows that stress is a trigger for her migraines. She has been using essential oil on the back of her neck and shoulders to help with muscle tension and headaches. She also takes B complex vitamin. She states that when she does get a headache it normally starts on the right side at the base of the neck and travels to the temporal area. She denies nausea and vomiting, photophobia and phonophobia with her migraines. She states that smells do bother her. She states that when she does get a headache she can  take Imitrex and that resolves her discomfort. She denies  any new medical issues. She returns today for an evaluation.    HISTORY 06/05/14 [facility]): [name] is a 45 y.o. female , who is seen here as a yearly revisit from Dr. Stephanie Acre. The patient has been diagnosed with migraines already many years ago and has followed the [facility] primarily. She has a strong family trait in her paternal family, her father, paternal uncles and paternal cousins have migraines.  She tried all preventive medications including beta blockers topiramate and did not achieve the protective effect. One trigger , that we'll give her a migraine is drinking alcohol. She had about 9-10 migraines per month before her twins were born and after that about 4-5 per month. The initial 2 years after the birth of her children were headache free, she is no longer taking birth control pills and seems to get her migraines now mostly during the placebo week with breakthrough menstruation.  When I followed the patient in every visit she was able to stop the preventive medications and I refilled Imitrex for her on 03-28-12. Her current headache frequency is named at 2-3 events per month- usual duration of the migraine headache is 1-3 days , she has a neck tension component and takes ibuprofen- if this fails she advances to imitrex po 50 mg. Associated are nausea, tiredness, not as much phono- or photophobia.   REVIEW OF SYSTEMS: Out of a complete 14 system review of symptoms, the patient complains only of the following symptoms, and all other reviewed systems are negative.  See history of present illness  ALLERGIES: Allergies  Allergen Reactions  . Erythromycin     HOME MEDICATIONS: Outpatient Medications Prior to Visit  Medication Sig Dispense Refill  . levocetirizine (XYZAL) 5 MG tablet TAKE 1 TABLET BY MOUTH ONCE EVERY EVENING  5  . SUMAtriptan (IMITREX) 50 MG tablet TAKE 1 TABLET BY MOUTH AS NEEDED FOR MIGRAINE, MAY REPEAT IN 2HRS IF  HEADACE PERSISTS OR RECURS 8 tablet 4  . norethindrone-ethinyl estradiol (OVCON-35,BALZIVA,BRIELLYN) 0.4-35 MG-MCG tablet Take 1 tablet by mouth daily.    . SUMAtriptan (IMITREX) 50 MG tablet TAKE 1 TABLET BY MOUTH AS NEEDED FOR MIGRAINE, MAY REPEAT IN 2HRS IF HEADACE PERSISTS OR RECURS 9 tablet 6   No facility-administered medications prior to visit.     PAST MEDICAL HISTORY: Past Medical History:  Diagnosis Date  . Asthma   . Cholesterol blood decreased    high cholesterol  . Cyst of brain    penal gland  . Heart murmur   . Migraine headache   . Migraine with status migrainosus 06/05/2014    PAST SURGICAL HISTORY: Past Surgical History:  Procedure Laterality Date  . CESAREAN SECTION  2009   (Twins- fraternal)  . mole removed     1989  . WISDOM TOOTH EXTRACTION      FAMILY HISTORY: Family History  Problem Relation Age of Onset  . Breast cancer Maternal Grandmother   . Breast cancer Paternal Grandfather   . Prostate cancer Maternal Grandfather   . Heart attack    . Heart attack    . Tremor Mother   . Fibromyalgia Mother     SOCIAL HISTORY: Social History   Social History  . Marital status: Married    Spouse name: [name]  . Number of children: 2  . Years of education: College   Occupational History  .  [employer]   Social History Main Topics  . Smoking status: Never Smoker  . Smokeless tobacco: Never Used  . Alcohol use No  . Drug use: No  . Sexual activity: Not on file   Other Topics Concern  . Not on file   Social History Narrative   Patient is married ([name]) and lives at home with her husband and twins (boy and a girl).   Patient is a Audiological scientist.   Patient drinks one cup of coffee daily.   Patient has a college education.   Patient is right-handed.      PHYSICAL EXAM  Vitals:   06/09/16 1042  BP: (!) 145/88  Pulse: 76  Weight: 137 lb (62.1 kg)  Height: '5\' 2"'$  (1.575 m)   Body mass index is 25.06 kg/m.  Generalized: Well developed,  in no acute distress   Neurological examination  Mentation: Alert oriented to time, place, history taking. Follows all commands speech and language fluent Cranial nerve II-XII: Pupils were equal round reactive to light. Extraocular movements were full, visual field were full on confrontational test. Facial sensation and strength were normal. Uvula tongue midline. Head turning and shoulder shrug  were normal and symmetric. Motor: The motor testing reveals 5 over 5 strength of all 4 extremities. Good symmetric motor tone is noted throughout.  Sensory: Sensory testing is intact to soft touch on all 4 extremities. No evidence of extinction is noted.  Coordination: Cerebellar testing reveals good finger-nose-finger and heel-to-shin bilaterally.  Gait and station: Gait is normal. Tandem gait is normal. Romberg is negative. No drift is seen.  Reflexes: Deep tendon reflexes are symmetric and normal bilaterally.   DIAGNOSTIC DATA (LABS, IMAGING,  TESTING) - I reviewed patient records, labs, notes, testing and imaging myself where available.  Lab Results  Component Value Date   WBC 12.4 (H) 12/16/2007   HGB 9.2 (L) 12/16/2007   HCT 26.1 (L) 12/16/2007   MCV 94.4 12/16/2007   PLT 113 (L) 12/16/2007      Component Value Date/Time   NA 140 12/15/2007 0535   K 4.3 12/15/2007 0535   CL 107 12/15/2007 0535   CO2 29 12/15/2007 0535   GLUCOSE 82 12/15/2007 0535   BUN 6 12/15/2007 0535   CREATININE 0.75 12/15/2007 0535   CALCIUM 8.8 12/15/2007 0535   PROT 4.9 (L) 12/15/2007 0535   ALBUMIN 2.1 (L) 12/15/2007 0535   AST 27 12/15/2007 0535   ALT 12 12/15/2007 0535   ALKPHOS 150 (H) 12/15/2007 0535   BILITOT 0.5 12/15/2007 0535   GFRNONAA >60 12/15/2007 0535   GFRAA  12/15/2007 0535    >60        The eGFR has been calculated using the MDRD equation. This calculation has not been validated in all clinical      ASSESSMENT AND PLAN 45 y.o. year old female  has a past medical history of Asthma;  Cholesterol blood decreased; Cyst of brain; Heart murmur; Migraine headache; and Migraine with status migrainosus (06/05/2014). here with:  1. Migraine headaches  Overall the patient is doing well. She will continue using Imitrex as needed for headache therapy. Advised patient that if her headache frequency increases she should let us know. Follow-up in one year or sooner if needed.    Ward Givens, MSN, NP-C 06/09/2016, 11:01 AM [facility] [address], [place], [place] [address] [phone]

## 2016-07-13 ENCOUNTER — Encounter (INDEPENDENT_AMBULATORY_CARE_PROVIDER_SITE_OTHER): Payer: Self-pay

## 2016-07-13 ENCOUNTER — Ambulatory Visit (INDEPENDENT_AMBULATORY_CARE_PROVIDER_SITE_OTHER): Payer: BLUE CROSS/BLUE SHIELD

## 2016-07-13 DIAGNOSIS — R42 Dizziness and giddiness: Secondary | ICD-10-CM | POA: Diagnosis not present

## 2016-07-13 DIAGNOSIS — R002 Palpitations: Secondary | ICD-10-CM | POA: Insufficient documentation

## 2016-07-15 ENCOUNTER — Other Ambulatory Visit (HOSPITAL_COMMUNITY): Payer: Self-pay

## 2016-08-09 DIAGNOSIS — Z23 Encounter for immunization: Secondary | ICD-10-CM | POA: Diagnosis not present

## 2016-08-30 DIAGNOSIS — J300 Vasomotor rhinitis: Secondary | ICD-10-CM | POA: Diagnosis not present

## 2016-08-30 DIAGNOSIS — H1045 Other chronic allergic conjunctivitis: Secondary | ICD-10-CM | POA: Diagnosis not present

## 2016-08-30 DIAGNOSIS — J4530 Mild persistent asthma, uncomplicated: Secondary | ICD-10-CM | POA: Diagnosis not present

## 2016-09-12 DIAGNOSIS — H5213 Myopia, bilateral: Secondary | ICD-10-CM | POA: Diagnosis not present

## 2016-11-24 ENCOUNTER — Other Ambulatory Visit: Payer: Self-pay | Admitting: Family Medicine

## 2016-11-24 DIAGNOSIS — Z1231 Encounter for screening mammogram for malignant neoplasm of breast: Secondary | ICD-10-CM

## 2017-01-02 ENCOUNTER — Ambulatory Visit
Admission: RE | Admit: 2017-01-02 | Discharge: 2017-01-02 | Disposition: A | Discharge status: Home / Self Care | Payer: BLUE CROSS/BLUE SHIELD | Source: Ambulatory Visit | Attending: Family Medicine | Admitting: Family Medicine

## 2017-01-02 DIAGNOSIS — Z1231 Encounter for screening mammogram for malignant neoplasm of breast: Secondary | ICD-10-CM

## 2017-01-04 DIAGNOSIS — N921 Excessive and frequent menstruation with irregular cycle: Secondary | ICD-10-CM | POA: Diagnosis not present

## 2017-05-25 ENCOUNTER — Encounter: Payer: Self-pay | Admitting: Adult Health

## 2017-05-25 ENCOUNTER — Ambulatory Visit (INDEPENDENT_AMBULATORY_CARE_PROVIDER_SITE_OTHER): Payer: BLUE CROSS/BLUE SHIELD | Admitting: Adult Health

## 2017-05-25 VITALS — BP 136/88 | HR 79 | Ht 62.0 in | Wt 144.0 lb

## 2017-05-25 DIAGNOSIS — G43009 Migraine without aura, not intractable, without status migrainosus: Secondary | ICD-10-CM

## 2017-05-25 MED ORDER — SUMATRIPTAN SUCCINATE 50 MG PO TABS: ORAL_TABLET | ORAL | 11 refills | Status: DC

## 2017-05-25 NOTE — Patient Instructions (Signed)
Continue Imitrex If your symptoms worsen or you develop new symptoms please let us know.   

## 2017-05-25 NOTE — Progress Notes (Signed)
PATIENT: [name] DOB: September 25, 1971  REASON FOR VISIT: follow up- migraine headaches HISTORY FROM: patient  HISTORY OF PRESENT ILLNESS: Today 05/25/17 [name] is a 46 year old female with a history of migraine headaches. She returns today for follow-up. She reports that her headaches have remained under relatively good control. She states that she has approximately 2-3 headaches a month. Her headaches typically start on the right side of the neck and radiate to the right. She reports she is also had headaches that is in the frontal region. She denies photophobia, phonophobia and nausea and vomiting. She reports Imitrex does work well for her headaches. She reports her birth-control pill also helps with her headaches. She states that her headache frequency is worse in the spring and summer. She returns today for an evaluation.  HISTORY 06/09/2016 :  The patient continues to use Imitrex to treat her acute migraines. She has approximately 2-3 migraine headaches a month. She also states that birth control is beneficial for her headaches. She states that she recently switched to a new birth control. while she was not on any medication for one week she did have several migraines. She states that her headaches typically occur on the right side. She denies photophobia, phonophobia. Occasionally she will have nausea but no vomiting. She states that smell is a trigger for her headaches. She states currently she is satisfied with using only Imitrex. She returns today for an evaluation.  REVIEW OF SYSTEMS: Out of a complete 14 system review of symptoms, the patient complains only of the following symptoms, and all other reviewed systems are negative.  See history of present illness  ALLERGIES: Allergies  Allergen Reactions  . Erythromycin     HOME MEDICATIONS: Outpatient Medications Prior to Visit  Medication Sig Dispense Refill  . levocetirizine (XYZAL) 5 MG tablet TAKE 1  TABLET BY MOUTH ONCE EVERY EVENING  5  . Multiple Vitamin (MULTIVITAMIN) tablet Take 1 tablet by mouth daily.    [name] OVER THE COUNTER MEDICATION cvs vit e cap daily    . OVER THE COUNTER MEDICATION Vit B complex (young living) daily    . OVER THE COUNTER MEDICATION Fish oil (most the time krill) daily    . SUMAtriptan (IMITREX) 50 MG tablet TAKE 1 TABLET BY MOUTH AS NEEDED FOR MIGRAINE, MAY REPEAT IN 2HRS IF HEADACE PERSISTS OR RECURS 8 tablet 11  . Norethin Ace-Eth Estrad-FE (TAYTULLA) 1-20 MG-MCG(24) CAPS Take by mouth.     No facility-administered medications prior to visit.     PAST MEDICAL HISTORY: Past Medical History:  Diagnosis Date  . Asthma   . Cholesterol blood decreased    high cholesterol  . Cyst of brain    penal gland  . Heart murmur   . Migraine headache   . Migraine with status migrainosus 06/05/2014    PAST SURGICAL HISTORY: Past Surgical History:  Procedure Laterality Date  . CESAREAN SECTION  2009   (Twins- fraternal)  . mole removed     1989  . WISDOM TOOTH EXTRACTION      FAMILY HISTORY: Family History  Problem Relation Age of Onset  . Tremor Mother   . Fibromyalgia Mother   . Breast cancer Maternal Grandmother   . Breast cancer Paternal Grandfather   . Prostate cancer Maternal Grandfather   . Heart attack Unknown   . Heart attack Unknown   . Breast cancer Paternal Aunt   . Breast cancer Paternal Grandmother     SOCIAL HISTORY: Social  History   Social History  . Marital status: Married    Spouse name: [name]  . Number of children: 2  . Years of education: College   Occupational History  .  [employer]   Social History Main Topics  . Smoking status: Never Smoker  . Smokeless tobacco: Never Used  . Alcohol use No  . Drug use: No  . Sexual activity: Not on file   Other Topics Concern  . Not on file   Social History Narrative   Patient is married ([name]) and lives at home with her husband and twins (boy and a girl).   Patient is a Mudlogger.   Patient drinks one cup of coffee daily.   Patient has a college education.   Patient is right-handed.      PHYSICAL EXAM  Vitals:   05/25/17 1026  BP: 136/88  Pulse: 79  Weight: 144 lb (65.3 kg)  Height: 5' 2" (1.575 m)   Body mass index is 26.34 kg/m.  Generalized: Well developed, in no acute distress   Neurological examination  Mentation: Alert oriented to time, place, history taking. Follows all commands speech and language fluent Cranial nerve II-XII: Pupils were equal round reactive to light. Extraocular movements were full, visual field were full on confrontational test. Facial sensation and strength were normal. Uvula tongue midline. Head turning and shoulder shrug  were normal and symmetric. Motor: The motor testing reveals 5 over 5 strength of all 4 extremities. Good symmetric motor tone is noted throughout.  Sensory: Sensory testing is intact to soft touch on all 4 extremities. No evidence of extinction is noted.  Coordination: Cerebellar testing reveals good finger-nose-finger and heel-to-shin bilaterally.  Gait and station: Gait is normal. Tandem gait is normal. Romberg is negative. No drift is seen.  Reflexes: Deep tendon reflexes are symmetric and normal bilaterally.   DIAGNOSTIC DATA (LABS, IMAGING, TESTING) - I reviewed patient records, labs, notes, testing and imaging myself where available.  Lab Results  Component Value Date   WBC 12.4 (H) 12/16/2007   HGB 9.2 (L) 12/16/2007   HCT 26.1 (L) 12/16/2007   MCV 94.4 12/16/2007   PLT 113 (L) 12/16/2007      Component Value Date/Time   NA 140 12/15/2007 0535   K 4.3 12/15/2007 0535   CL 107 12/15/2007 0535   CO2 29 12/15/2007 0535   GLUCOSE 82 12/15/2007 0535   BUN 6 12/15/2007 0535   CREATININE 0.75 12/15/2007 0535   CALCIUM 8.8 12/15/2007 0535   PROT 4.9 (L) 12/15/2007 0535   ALBUMIN 2.1 (L) 12/15/2007 0535   AST 27 12/15/2007 0535   ALT 12 12/15/2007 0535   ALKPHOS 150 (H) 12/15/2007  0535   BILITOT 0.5 12/15/2007 0535   GFRNONAA >60 12/15/2007 0535   GFRAA  12/15/2007 0535    >60        The eGFR has been calculated using the MDRD equation. This calculation has not been validated in all clinical      ASSESSMENT AND PLAN 46 y.o. year old female  has a past medical history of Asthma; Cholesterol blood decreased; Cyst of brain; Heart murmur; Migraine headache; and Migraine with status migrainosus (06/05/2014). here with:  1. Migraine headaches  Overall the patient is doing well. She will continue using Imitrex as needed for headaches. She is advised that if her headache frequency increases or she develops new symptoms she she'll let us know. She will follow-up in 1 year or sooner if needed.  I spent 15 minutes with the patient. 50% of this time was spent reviewing medication options.       Ward Givens, MSN, NP-C 05/25/2017, 10:38 AM  [facility] [address], [place], [place] [address] [phone]

## 2017-05-26 NOTE — Progress Notes (Signed)
I have read the note, and I agree with the clinical assessment and plan.  Kysean Sweet KEITH   

## 2017-06-08 ENCOUNTER — Ambulatory Visit: Payer: BLUE CROSS/BLUE SHIELD | Admitting: Adult Health

## 2017-06-21 DIAGNOSIS — Z6826 Body mass index (BMI) 26.0-26.9, adult: Secondary | ICD-10-CM | POA: Diagnosis not present

## 2017-06-21 DIAGNOSIS — Z01419 Encounter for gynecological examination (general) (routine) without abnormal findings: Secondary | ICD-10-CM | POA: Diagnosis not present

## 2017-07-03 DIAGNOSIS — H1045 Other chronic allergic conjunctivitis: Secondary | ICD-10-CM | POA: Diagnosis not present

## 2017-07-03 DIAGNOSIS — J300 Vasomotor rhinitis: Secondary | ICD-10-CM | POA: Diagnosis not present

## 2017-07-03 DIAGNOSIS — J4530 Mild persistent asthma, uncomplicated: Secondary | ICD-10-CM | POA: Diagnosis not present

## 2017-07-21 DIAGNOSIS — Z23 Encounter for immunization: Secondary | ICD-10-CM | POA: Diagnosis not present

## 2017-09-05 DIAGNOSIS — H5212 Myopia, left eye: Secondary | ICD-10-CM | POA: Diagnosis not present

## 2017-10-24 DIAGNOSIS — M9901 Segmental and somatic dysfunction of cervical region: Secondary | ICD-10-CM | POA: Diagnosis not present

## 2017-10-24 DIAGNOSIS — M9903 Segmental and somatic dysfunction of lumbar region: Secondary | ICD-10-CM | POA: Diagnosis not present

## 2017-10-24 DIAGNOSIS — M9904 Segmental and somatic dysfunction of sacral region: Secondary | ICD-10-CM | POA: Diagnosis not present

## 2017-10-24 DIAGNOSIS — M9902 Segmental and somatic dysfunction of thoracic region: Secondary | ICD-10-CM | POA: Diagnosis not present

## 2017-10-27 DIAGNOSIS — M9904 Segmental and somatic dysfunction of sacral region: Secondary | ICD-10-CM | POA: Diagnosis not present

## 2017-10-27 DIAGNOSIS — M9902 Segmental and somatic dysfunction of thoracic region: Secondary | ICD-10-CM | POA: Diagnosis not present

## 2017-10-27 DIAGNOSIS — M9903 Segmental and somatic dysfunction of lumbar region: Secondary | ICD-10-CM | POA: Diagnosis not present

## 2017-10-27 DIAGNOSIS — M9901 Segmental and somatic dysfunction of cervical region: Secondary | ICD-10-CM | POA: Diagnosis not present

## 2017-11-02 DIAGNOSIS — M9903 Segmental and somatic dysfunction of lumbar region: Secondary | ICD-10-CM | POA: Diagnosis not present

## 2017-11-02 DIAGNOSIS — M9904 Segmental and somatic dysfunction of sacral region: Secondary | ICD-10-CM | POA: Diagnosis not present

## 2017-11-02 DIAGNOSIS — M9901 Segmental and somatic dysfunction of cervical region: Secondary | ICD-10-CM | POA: Diagnosis not present

## 2017-11-02 DIAGNOSIS — M9902 Segmental and somatic dysfunction of thoracic region: Secondary | ICD-10-CM | POA: Diagnosis not present

## 2017-11-16 DIAGNOSIS — M9903 Segmental and somatic dysfunction of lumbar region: Secondary | ICD-10-CM | POA: Diagnosis not present

## 2017-11-16 DIAGNOSIS — M9901 Segmental and somatic dysfunction of cervical region: Secondary | ICD-10-CM | POA: Diagnosis not present

## 2017-11-16 DIAGNOSIS — M9902 Segmental and somatic dysfunction of thoracic region: Secondary | ICD-10-CM | POA: Diagnosis not present

## 2017-11-16 DIAGNOSIS — M9904 Segmental and somatic dysfunction of sacral region: Secondary | ICD-10-CM | POA: Diagnosis not present

## 2017-12-04 ENCOUNTER — Other Ambulatory Visit: Payer: Self-pay | Admitting: Obstetrics and Gynecology

## 2017-12-04 DIAGNOSIS — Z1231 Encounter for screening mammogram for malignant neoplasm of breast: Secondary | ICD-10-CM

## 2017-12-14 DIAGNOSIS — M9902 Segmental and somatic dysfunction of thoracic region: Secondary | ICD-10-CM | POA: Diagnosis not present

## 2017-12-14 DIAGNOSIS — M9903 Segmental and somatic dysfunction of lumbar region: Secondary | ICD-10-CM | POA: Diagnosis not present

## 2017-12-14 DIAGNOSIS — M9901 Segmental and somatic dysfunction of cervical region: Secondary | ICD-10-CM | POA: Diagnosis not present

## 2017-12-14 DIAGNOSIS — M9904 Segmental and somatic dysfunction of sacral region: Secondary | ICD-10-CM | POA: Diagnosis not present

## 2018-01-03 ENCOUNTER — Ambulatory Visit
Admission: RE | Admit: 2018-01-03 | Discharge: 2018-01-03 | Disposition: A | Discharge status: Home / Self Care | Payer: BLUE CROSS/BLUE SHIELD | Source: Ambulatory Visit | Attending: Obstetrics and Gynecology | Admitting: Obstetrics and Gynecology

## 2018-01-03 DIAGNOSIS — Z1231 Encounter for screening mammogram for malignant neoplasm of breast: Secondary | ICD-10-CM | POA: Diagnosis not present

## 2018-01-10 DIAGNOSIS — S93491A Sprain of other ligament of right ankle, initial encounter: Secondary | ICD-10-CM | POA: Diagnosis not present

## 2018-01-10 DIAGNOSIS — M25571 Pain in right ankle and joints of right foot: Secondary | ICD-10-CM | POA: Diagnosis not present

## 2018-01-11 DIAGNOSIS — M9903 Segmental and somatic dysfunction of lumbar region: Secondary | ICD-10-CM | POA: Diagnosis not present

## 2018-01-11 DIAGNOSIS — M9901 Segmental and somatic dysfunction of cervical region: Secondary | ICD-10-CM | POA: Diagnosis not present

## 2018-01-11 DIAGNOSIS — M9904 Segmental and somatic dysfunction of sacral region: Secondary | ICD-10-CM | POA: Diagnosis not present

## 2018-01-11 DIAGNOSIS — M9902 Segmental and somatic dysfunction of thoracic region: Secondary | ICD-10-CM | POA: Diagnosis not present

## 2018-05-28 ENCOUNTER — Encounter: Payer: Self-pay | Admitting: Adult Health

## 2018-05-28 ENCOUNTER — Ambulatory Visit: Payer: BLUE CROSS/BLUE SHIELD | Admitting: Adult Health

## 2018-05-28 VITALS — Ht 62.0 in | Wt 144.0 lb

## 2018-05-28 DIAGNOSIS — G43009 Migraine without aura, not intractable, without status migrainosus: Secondary | ICD-10-CM | POA: Diagnosis not present

## 2018-05-28 MED ORDER — SUMATRIPTAN SUCCINATE 50 MG PO TABS: ORAL_TABLET | ORAL | 11 refills | Status: DC

## 2018-05-28 NOTE — Progress Notes (Signed)
I have read the note, and I agree with the clinical assessment and plan.  Charles K Willis   

## 2018-05-28 NOTE — Progress Notes (Signed)
PATIENT: [name] DOB: 02-10-1971  REASON FOR VISIT: follow up HISTORY FROM: patient  HISTORY OF PRESENT ILLNESS: Today 05/28/18 [name] is a 47 year old female with a history of migraine headaches.  She returns today for follow-up.  She reports that her migraines have remained stable.  She continues to have 2-3 headaches a month.  Her headaches typically occur on the right.  She takes imitrex at the onset of her headache and typically her headache will resolve in 1 hour.  If she has let her headache lingers she will develop further.  Overall she feels that she is doing well.  She returns today for evaluation.  HISTORY 05/25/17: 05/25/17 [name] a 47 year old female with a history of migraine headaches. She returns today for follow-up. She reports that her headaches have remained under relatively good control. She states that she has approximately 2-3 headaches a month. Her headaches typically start on the right side of the neck and radiate to the right. She reports she is also had headaches that is in the frontal region. She denies photophobia, phonophobia and nausea and vomiting. She reports Imitrex does work well for her headaches. She reports her birth-control pill also helps with her headaches. She states that her headache frequency is worse in the spring and summer. She returns today for an evaluation.  REVIEW OF SYSTEMS: Out of a complete 14 system review of symptoms, the patient complains only of the following symptoms, and all other reviewed systems are negative.  Murmur, frequent waking, daytime sleepiness  ALLERGIES: Allergies  Allergen Reactions  . Erythromycin   . Other Itching    Almonds     HOME MEDICATIONS: Outpatient Medications Prior to Visit  Medication Sig Dispense Refill  . levocetirizine (XYZAL) 5 MG tablet TAKE 1 TABLET BY MOUTH ONCE EVERY EVENING  5  . Levonorgestrel-Ethinyl Estradiol (AMETHIA,CAMRESE) 0.1-0.02 & 0.01 MG  tablet   2  . Multiple Vitamin (MULTIVITAMIN) tablet Take 1 tablet by mouth daily.    . Olopatadine HCl 0.2 % SOLN olopatadine 0.2 % eye drops  INSTILL 1 DROP INTO AFFECTED EYE(S) BY OPHTHALMIC ROUTE ONCE DAILY    . OVER THE COUNTER MEDICATION cvs vit e cap daily    . OVER THE COUNTER MEDICATION Vit B complex (young living) daily    . OVER THE COUNTER MEDICATION Fish oil (most the time krill) daily    . SUMAtriptan (IMITREX) 50 MG tablet TAKE 1 TABLET BY MOUTH at the onset of MIGRAINE, MAY REPEAT IN 2HRS IF HEADACE PERSISTS OR RECURS 8 tablet 11   No facility-administered medications prior to visit.     PAST MEDICAL HISTORY: Past Medical History:  Diagnosis Date  . Asthma   . Cholesterol blood decreased    high cholesterol  . Cyst of brain    penal gland  . Heart murmur   . Migraine headache   . Migraine with status migrainosus 06/05/2014    PAST SURGICAL HISTORY: Past Surgical History:  Procedure Laterality Date  . CESAREAN SECTION  2009   (Twins- fraternal)  . mole removed     1989  . WISDOM TOOTH EXTRACTION      FAMILY HISTORY: Family History  Problem Relation Age of Onset  . Tremor Mother   . Fibromyalgia Mother   . Breast cancer Maternal Grandmother   . Breast cancer Paternal Grandfather   . Prostate cancer Maternal Grandfather   . Heart attack Unknown   . Heart attack Unknown   . Breast cancer  Paternal Aunt   . Breast cancer Paternal Grandmother     SOCIAL HISTORY: Social History   Socioeconomic History  . Marital status: Married    Spouse name: [name]  . Number of children: 2  . Years of education: College  . Highest education level: Not on file  Occupational History    Employer: Administrator  Social Needs  . Financial resource strain: Not on file  . Food insecurity:    Worry: Not on file    Inability: Not on file  . Transportation needs:    Medical: Not on file    Non-medical: Not on file  Tobacco Use  . Smoking status: Never Smoker  .  Smokeless tobacco: Never Used  Substance and Sexual Activity  . Alcohol use: No    Alcohol/week: 0.0 standard drinks  . Drug use: No  . Sexual activity: Not on file  Lifestyle  . Physical activity:    Days per week: Not on file    Minutes per session: Not on file  . Stress: Not on file  Relationships  . Social connections:    Talks on phone: Not on file    Gets together: Not on file    Attends religious service: Not on file    Active member of club or organization: Not on file    Attends meetings of clubs or organizations: Not on file    Relationship status: Not on file  . Intimate partner violence:    Fear of current or ex partner: Not on file    Emotionally abused: Not on file    Physically abused: Not on file    Forced sexual activity: Not on file  Other Topics Concern  . Not on file  Social History Narrative   Patient is married ([name]) and lives at home with her husband and twins (boy and a girl).   Patient is a Audiological scientist.   Patient drinks one cup of coffee daily.   Patient has a college education.   Patient is right-handed.      PHYSICAL EXAM  Vitals:   05/28/18 0911  Weight: 144 lb (65.3 kg)  Height: _0  (1.575 m)   Body mass index is 26.34 kg/m.  Generalized: Well developed, in no acute distress   Neurological examination  Mentation: Alert oriented to time, place, history taking. Follows all commands speech and language fluent Cranial nerve II-XII: Pupils were equal round reactive to light. Extraocular movements were full, visual field were full on confrontational test. Facial sensation and strength were normal. Uvula tongue midline. Head turning and shoulder shrug  were normal and symmetric. Motor: The motor testing reveals 5 over 5 strength of all 4 extremities. Good symmetric motor tone is noted throughout.  Sensory: Sensory testing is intact to soft touch on all 4 extremities. No evidence of extinction is noted.  Coordination: Cerebellar testing  reveals good finger-nose-finger and heel-to-shin bilaterally.  Gait and station: Gait is normal. Tandem gait is normal. Romberg is negative. No drift is seen.  Reflexes: Deep tendon reflexes are symmetric and normal bilaterally.   DIAGNOSTIC DATA (LABS, IMAGING, TESTING) - I reviewed patient records, labs, notes, testing and imaging myself where available.  Lab Results  Component Value Date   WBC 12.4 (H) 12/16/2007   HGB 9.2 (L) 12/16/2007   HCT 26.1 (L) 12/16/2007   MCV 94.4 12/16/2007   PLT 113 (L) 12/16/2007      Component Value Date/Time   NA 140 12/15/2007 0535  K 4.3 12/15/2007 0535   CL 107 12/15/2007 0535   CO2 29 12/15/2007 0535   GLUCOSE 82 12/15/2007 0535   BUN 6 12/15/2007 0535   CREATININE 0.75 12/15/2007 0535   CALCIUM 8.8 12/15/2007 0535   PROT 4.9 (L) 12/15/2007 0535   ALBUMIN 2.1 (L) 12/15/2007 0535   AST 27 12/15/2007 0535   ALT 12 12/15/2007 0535   ALKPHOS 150 (H) 12/15/2007 0535   BILITOT 0.5 12/15/2007 0535   GFRNONAA >60 12/15/2007 0535   GFRAA  12/15/2007 0535    >60        The eGFR has been calculated using the MDRD equation. This calculation has not been validated in all clinical   No results found for: CHOL, HDL, LDLCALC, LDLDIRECT, TRIG, CHOLHDL No results found for: HGBA1C No results found for: VITAMINB12 No results found for: TSH    ASSESSMENT AND PLAN 47 y.o. year old female  has a past medical history of Asthma, Cholesterol blood decreased, Cyst of brain, Heart murmur, Migraine headache, and Migraine with status migrainosus (06/05/2014). here with:  1.  Migraine headaches  Overall the patient has remained stable.  She will continue using Imitrex as needed.  I advised that if her headache frequency or severity increases she should let us know.  She will follow-up in 1 year or sooner if needed.   I spent 15 minutes with the patient. 50% of this time was spent discussing her migraine frequency and severity.   Ward Givens, MSN,  NP-C 05/28/2018, 9:31 AM [facility] [address] [place], [place] [address] [phone]

## 2018-05-28 NOTE — Patient Instructions (Signed)
Your Plan:  Continue Imitrex If your symptoms worsen or you develop new symptoms please let us know.   Thank you for coming to see us at [facility]. I hope we have been able to provide you high quality care today.  You may receive a patient satisfaction survey over the next few weeks. We would appreciate your feedback and comments so that we may continue to improve ourselves and the health of our patients.  

## 2018-06-12 DIAGNOSIS — G43909 Migraine, unspecified, not intractable, without status migrainosus: Secondary | ICD-10-CM | POA: Diagnosis not present

## 2018-06-12 DIAGNOSIS — Z23 Encounter for immunization: Secondary | ICD-10-CM | POA: Diagnosis not present

## 2018-06-12 DIAGNOSIS — K219 Gastro-esophageal reflux disease without esophagitis: Secondary | ICD-10-CM | POA: Diagnosis not present

## 2018-06-12 DIAGNOSIS — Z3041 Encounter for surveillance of contraceptive pills: Secondary | ICD-10-CM | POA: Diagnosis not present

## 2018-06-12 DIAGNOSIS — J309 Allergic rhinitis, unspecified: Secondary | ICD-10-CM | POA: Diagnosis not present

## 2018-06-25 DIAGNOSIS — Z1212 Encounter for screening for malignant neoplasm of rectum: Secondary | ICD-10-CM | POA: Diagnosis not present

## 2018-06-25 DIAGNOSIS — Z8042 Family history of malignant neoplasm of prostate: Secondary | ICD-10-CM | POA: Diagnosis not present

## 2018-06-25 DIAGNOSIS — Z01419 Encounter for gynecological examination (general) (routine) without abnormal findings: Secondary | ICD-10-CM | POA: Diagnosis not present

## 2018-06-25 DIAGNOSIS — Z803 Family history of malignant neoplasm of breast: Secondary | ICD-10-CM | POA: Diagnosis not present

## 2018-06-25 DIAGNOSIS — Z6826 Body mass index (BMI) 26.0-26.9, adult: Secondary | ICD-10-CM | POA: Diagnosis not present

## 2018-06-27 DIAGNOSIS — J209 Acute bronchitis, unspecified: Secondary | ICD-10-CM | POA: Diagnosis not present

## 2018-06-27 DIAGNOSIS — J309 Allergic rhinitis, unspecified: Secondary | ICD-10-CM | POA: Diagnosis not present

## 2018-06-27 DIAGNOSIS — J069 Acute upper respiratory infection, unspecified: Secondary | ICD-10-CM | POA: Diagnosis not present

## 2018-06-27 DIAGNOSIS — Z6826 Body mass index (BMI) 26.0-26.9, adult: Secondary | ICD-10-CM | POA: Diagnosis not present

## 2018-08-07 DIAGNOSIS — Z809 Family history of malignant neoplasm, unspecified: Secondary | ICD-10-CM | POA: Diagnosis not present

## 2018-08-13 DIAGNOSIS — T7840XA Allergy, unspecified, initial encounter: Secondary | ICD-10-CM | POA: Diagnosis not present

## 2018-08-13 DIAGNOSIS — K219 Gastro-esophageal reflux disease without esophagitis: Secondary | ICD-10-CM | POA: Diagnosis not present

## 2018-08-13 DIAGNOSIS — G43909 Migraine, unspecified, not intractable, without status migrainosus: Secondary | ICD-10-CM | POA: Diagnosis not present

## 2018-08-13 DIAGNOSIS — Z6826 Body mass index (BMI) 26.0-26.9, adult: Secondary | ICD-10-CM | POA: Diagnosis not present

## 2018-08-21 DIAGNOSIS — Z13220 Encounter for screening for lipoid disorders: Secondary | ICD-10-CM | POA: Diagnosis not present

## 2018-08-21 DIAGNOSIS — Z1329 Encounter for screening for other suspected endocrine disorder: Secondary | ICD-10-CM | POA: Diagnosis not present

## 2018-08-21 DIAGNOSIS — Z0000 Encounter for general adult medical examination without abnormal findings: Secondary | ICD-10-CM | POA: Diagnosis not present

## 2018-08-21 DIAGNOSIS — Z114 Encounter for screening for human immunodeficiency virus [HIV]: Secondary | ICD-10-CM | POA: Diagnosis not present

## 2018-08-30 DIAGNOSIS — H1013 Acute atopic conjunctivitis, bilateral: Secondary | ICD-10-CM | POA: Diagnosis not present

## 2018-08-30 DIAGNOSIS — Z6826 Body mass index (BMI) 26.0-26.9, adult: Secondary | ICD-10-CM | POA: Diagnosis not present

## 2018-08-30 DIAGNOSIS — Z23 Encounter for immunization: Secondary | ICD-10-CM | POA: Diagnosis not present

## 2018-08-30 DIAGNOSIS — Z0000 Encounter for general adult medical examination without abnormal findings: Secondary | ICD-10-CM | POA: Diagnosis not present

## 2018-08-30 DIAGNOSIS — J309 Allergic rhinitis, unspecified: Secondary | ICD-10-CM | POA: Diagnosis not present

## 2018-09-27 DIAGNOSIS — Z23 Encounter for immunization: Secondary | ICD-10-CM | POA: Diagnosis not present

## 2018-10-22 DIAGNOSIS — H5213 Myopia, bilateral: Secondary | ICD-10-CM | POA: Diagnosis not present

## 2018-12-25 ENCOUNTER — Other Ambulatory Visit: Payer: Self-pay | Admitting: Obstetrics and Gynecology

## 2018-12-25 DIAGNOSIS — Z1231 Encounter for screening mammogram for malignant neoplasm of breast: Secondary | ICD-10-CM

## 2019-01-17 ENCOUNTER — Other Ambulatory Visit: Payer: Self-pay | Admitting: Obstetrics and Gynecology

## 2019-01-17 DIAGNOSIS — Z1231 Encounter for screening mammogram for malignant neoplasm of breast: Secondary | ICD-10-CM

## 2019-01-23 DIAGNOSIS — G43909 Migraine, unspecified, not intractable, without status migrainosus: Secondary | ICD-10-CM | POA: Diagnosis not present

## 2019-01-23 DIAGNOSIS — J309 Allergic rhinitis, unspecified: Secondary | ICD-10-CM | POA: Diagnosis not present

## 2019-01-23 DIAGNOSIS — K219 Gastro-esophageal reflux disease without esophagitis: Secondary | ICD-10-CM | POA: Diagnosis not present

## 2019-01-23 DIAGNOSIS — J45909 Unspecified asthma, uncomplicated: Secondary | ICD-10-CM | POA: Diagnosis not present

## 2019-02-27 DIAGNOSIS — R739 Hyperglycemia, unspecified: Secondary | ICD-10-CM | POA: Diagnosis not present

## 2019-02-27 DIAGNOSIS — E782 Mixed hyperlipidemia: Secondary | ICD-10-CM | POA: Diagnosis not present

## 2019-03-01 DIAGNOSIS — E782 Mixed hyperlipidemia: Secondary | ICD-10-CM | POA: Diagnosis not present

## 2019-03-01 DIAGNOSIS — G43909 Migraine, unspecified, not intractable, without status migrainosus: Secondary | ICD-10-CM | POA: Diagnosis not present

## 2019-03-01 DIAGNOSIS — J45909 Unspecified asthma, uncomplicated: Secondary | ICD-10-CM | POA: Diagnosis not present

## 2019-03-01 DIAGNOSIS — J309 Allergic rhinitis, unspecified: Secondary | ICD-10-CM | POA: Diagnosis not present

## 2019-03-06 DIAGNOSIS — Z23 Encounter for immunization: Secondary | ICD-10-CM | POA: Diagnosis not present

## 2019-03-13 ENCOUNTER — Other Ambulatory Visit: Payer: Self-pay

## 2019-03-13 ENCOUNTER — Ambulatory Visit
Admission: RE | Admit: 2019-03-13 | Discharge: 2019-03-13 | Disposition: A | Discharge status: Home / Self Care | Payer: BLUE CROSS/BLUE SHIELD | Source: Ambulatory Visit | Attending: Obstetrics and Gynecology | Admitting: Obstetrics and Gynecology

## 2019-03-13 DIAGNOSIS — Z1231 Encounter for screening mammogram for malignant neoplasm of breast: Secondary | ICD-10-CM

## 2019-03-14 ENCOUNTER — Other Ambulatory Visit: Payer: Self-pay | Admitting: Obstetrics and Gynecology

## 2019-03-14 DIAGNOSIS — R928 Other abnormal and inconclusive findings on diagnostic imaging of breast: Secondary | ICD-10-CM

## 2019-03-20 ENCOUNTER — Ambulatory Visit
Admission: RE | Admit: 2019-03-20 | Discharge: 2019-03-20 | Disposition: A | Discharge status: Home / Self Care | Payer: BLUE CROSS/BLUE SHIELD | Source: Ambulatory Visit | Attending: Obstetrics and Gynecology | Admitting: Obstetrics and Gynecology

## 2019-03-20 ENCOUNTER — Other Ambulatory Visit: Payer: Self-pay

## 2019-03-20 DIAGNOSIS — R928 Other abnormal and inconclusive findings on diagnostic imaging of breast: Secondary | ICD-10-CM

## 2019-03-28 ENCOUNTER — Telehealth: Payer: Self-pay

## 2019-03-28 NOTE — Telephone Encounter (Signed)
Unable to get in contact with the patient to offer a sooner appt with Amy. I left a voicemail asking her to return my call. Office number was provided.   If patient calls back please scheduel their appt with Amy via mychart.

## 2019-04-05 DIAGNOSIS — Z20828 Contact with and (suspected) exposure to other viral communicable diseases: Secondary | ICD-10-CM | POA: Diagnosis not present

## 2019-05-07 DIAGNOSIS — E782 Mixed hyperlipidemia: Secondary | ICD-10-CM | POA: Diagnosis not present

## 2019-05-10 DIAGNOSIS — E782 Mixed hyperlipidemia: Secondary | ICD-10-CM | POA: Diagnosis not present

## 2019-06-03 ENCOUNTER — Ambulatory Visit: Payer: BLUE CROSS/BLUE SHIELD | Admitting: Adult Health

## 2019-07-04 DIAGNOSIS — Z23 Encounter for immunization: Secondary | ICD-10-CM | POA: Diagnosis not present

## 2019-07-08 DIAGNOSIS — Z01419 Encounter for gynecological examination (general) (routine) without abnormal findings: Secondary | ICD-10-CM | POA: Diagnosis not present

## 2019-07-08 DIAGNOSIS — Z6826 Body mass index (BMI) 26.0-26.9, adult: Secondary | ICD-10-CM | POA: Diagnosis not present

## 2019-08-05 ENCOUNTER — Ambulatory Visit: Payer: BLUE CROSS/BLUE SHIELD | Admitting: Adult Health

## 2019-08-14 ENCOUNTER — Ambulatory Visit: Payer: BC Managed Care – PPO | Admitting: Adult Health

## 2019-08-14 ENCOUNTER — Other Ambulatory Visit: Payer: Self-pay

## 2019-08-14 ENCOUNTER — Encounter: Payer: Self-pay | Admitting: Adult Health

## 2019-08-14 VITALS — BP 146/98 | HR 74 | Temp 97.8°F | Ht 62.0 in | Wt 146.4 lb

## 2019-08-14 DIAGNOSIS — G43009 Migraine without aura, not intractable, without status migrainosus: Secondary | ICD-10-CM

## 2019-08-14 MED ORDER — SUMATRIPTAN SUCCINATE 50 MG PO TABS: ORAL_TABLET | ORAL | 11 refills | Status: AC

## 2019-08-14 NOTE — Progress Notes (Signed)
PATIENT: [name] DOB: 1970/10/26  REASON FOR VISIT: follow up HISTORY FROM: patient  HISTORY OF PRESENT ILLNESS: Today 08/14/19:  Ms. [name] is a 48 year old female with a history of migraine headaches.  She returns today for follow-up.  She is not on any preventative therapy.  She states her headaches are under relatively good control.  She continues to have 2-3 headaches each month.  Her headaches resolve fairly quickly with Imitrex.  She denies any new issues.  She returns today for an evaluation.  HISTORY  05/28/18 Ms. [name] is a 48 year old female with a history of migraine headaches.  She returns today for follow-up.  She reports that her migraines have remained stable.  She continues to have 2-3 headaches a month.  Her headaches typically occur on the right.  She takes imitrex at the onset of her headache and typically her headache will resolve in 1 hour.  If she has let her headache lingers she will develop further.  Overall she feels that she is doing well.  She returns today for evaluation.  REVIEW OF SYSTEMS: Out of a complete 14 system review of symptoms, the patient complains only of the following symptoms, and all other reviewed systems are negative.  See HPI  ALLERGIES: Allergies  Allergen Reactions  . Erythromycin   . Other Itching    Almonds     HOME MEDICATIONS: Outpatient Medications Prior to Visit  Medication Sig Dispense Refill  . levocetirizine (XYZAL) 5 MG tablet TAKE 1 TABLET BY MOUTH ONCE EVERY EVENING  5  . Levonorgestrel-Ethinyl Estradiol (AMETHIA,CAMRESE) 0.1-0.02 & 0.01 MG tablet   2  . Multiple Vitamin (MULTIVITAMIN) tablet Take 1 tablet by mouth daily.    . Olopatadine HCl 0.2 % SOLN olopatadine 0.2 % eye drops  INSTILL 1 DROP INTO AFFECTED EYE(S) BY OPHTHALMIC ROUTE ONCE DAILY    . OVER THE COUNTER MEDICATION cvs vit e cap daily    . OVER THE COUNTER MEDICATION Vit B complex (young living) daily    . OVER  THE COUNTER MEDICATION Fish oil (most the time krill) daily    . SUMAtriptan (IMITREX) 50 MG tablet take 1 tab PO at the onset of migraine. May repeat in 2 hours if needed. Not to exceed 2 tablets in 24 hours. Must last 28 days. 8 tablet 11   No facility-administered medications prior to visit.     PAST MEDICAL HISTORY: Past Medical History:  Diagnosis Date  . Asthma   . Cholesterol blood decreased    high cholesterol  . Cyst of brain    penal gland  . Heart murmur   . Migraine headache   . Migraine with status migrainosus 06/05/2014    PAST SURGICAL HISTORY: Past Surgical History:  Procedure Laterality Date  . CESAREAN SECTION  2009   (Twins- fraternal)  . mole removed     1989  . WISDOM TOOTH EXTRACTION      FAMILY HISTORY: Family History  Problem Relation Age of Onset  . Tremor Mother   . Fibromyalgia Mother   . Breast cancer Maternal Grandmother   . Breast cancer Paternal Grandfather   . Prostate cancer Maternal Grandfather   . Heart attack Other   . Heart attack Other   . Breast cancer Paternal Aunt   . Breast cancer Paternal Grandmother     SOCIAL HISTORY: Social History   Socioeconomic History  . Marital status: Married    Spouse name: [name]  . Number of children:  2  . Years of education: College  . Highest education level: Not on file  Occupational History    Employer: Administrator  Social Needs  . Financial resource strain: Not on file  . Food insecurity    Worry: Not on file    Inability: Not on file  . Transportation needs    Medical: Not on file    Non-medical: Not on file  Tobacco Use  . Smoking status: Never Smoker  . Smokeless tobacco: Never Used  Substance and Sexual Activity  . Alcohol use: No    Alcohol/week: 0.0 standard drinks  . Drug use: No  . Sexual activity: Not on file  Lifestyle  . Physical activity    Days per week: Not on file    Minutes per session: Not on file  . Stress: Not on file  Relationships  . Social  Herbalist on phone: Not on file    Gets together: Not on file    Attends religious service: Not on file    Active member of club or organization: Not on file    Attends meetings of clubs or organizations: Not on file    Relationship status: Not on file  . Intimate partner violence    Fear of current or ex partner: Not on file    Emotionally abused: Not on file    Physically abused: Not on file    Forced sexual activity: Not on file  Other Topics Concern  . Not on file  Social History Narrative   Patient is married ([name]) and lives at home with her husband and twins (boy and a girl).   Patient is a Audiological scientist.   Patient drinks one cup of coffee daily.   Patient has a college education.   Patient is right-handed.      PHYSICAL EXAM  Vitals:   08/14/19 1312 08/14/19 1314 08/14/19 1339  BP: (!) 150/100 (!) 154/102 (!) 146/98  Pulse: 74    Temp: 97.8 F (36.6 C)    Weight: 146 lb 6.4 oz (66.4 kg)    Height: 5' 2" (1.575 m)     Body mass index is 26.78 kg/m.  Generalized: Well developed, in no acute distress   Neurological examination  Mentation: Alert oriented to time, place, history taking. Follows all commands speech and language fluent Cranial nerve II-XII: Pupils were equal round reactive to light. Extraocular movements were full, visual field were full on confrontational test. Facial sensation and strength were normal. Uvula tongue midline. Head turning and shoulder shrug  were normal and symmetric. Motor: The motor testing reveals 5 over 5 strength of all 4 extremities. Good symmetric motor tone is noted throughout.  Sensory: Sensory testing is intact to soft touch on all 4 extremities. No evidence of extinction is noted.  Coordination: Cerebellar testing reveals good finger-nose-finger and heel-to-shin bilaterally.  Gait and station: Gait is normal. Tandem gait is normal. Romberg is negative. No drift is seen.  Reflexes: Deep tendon reflexes are symmetric  and normal bilaterally.   DIAGNOSTIC DATA (LABS, IMAGING, TESTING) - I reviewed patient records, labs, notes, testing and imaging myself where available.  Lab Results  Component Value Date   WBC 12.4 (H) 12/16/2007   HGB 9.2 (L) 12/16/2007   HCT 26.1 (L) 12/16/2007   MCV 94.4 12/16/2007   PLT 113 (L) 12/16/2007      Component Value Date/Time   NA 140 12/15/2007 0535   K 4.3 12/15/2007 0535  CL 107 12/15/2007 0535   CO2 29 12/15/2007 0535   GLUCOSE 82 12/15/2007 0535   BUN 6 12/15/2007 0535   CREATININE 0.75 12/15/2007 0535   CALCIUM 8.8 12/15/2007 0535   PROT 4.9 (L) 12/15/2007 0535   ALBUMIN 2.1 (L) 12/15/2007 0535   AST 27 12/15/2007 0535   ALT 12 12/15/2007 0535   ALKPHOS 150 (H) 12/15/2007 0535   BILITOT 0.5 12/15/2007 0535   GFRNONAA >60 12/15/2007 0535   GFRAA  12/15/2007 0535    >60        The eGFR has been calculated using the MDRD equation. This calculation has not been validated in all clinical      ASSESSMENT AND PLAN 48 y.o. year old female  has a past medical history of Asthma, Cholesterol blood decreased, Cyst of brain, Heart murmur, Migraine headache, and Migraine with status migrainosus (06/05/2014). here with:  Migraine headaches  - Continue Imitrex  Migraines have been stable. Imitrex can be managed by PCP if they are amendable. Patient can follow-up on an as needed basis.   I spent 15 minutes with the patient. 50% of this time was spent discussing plan of care   Ward Givens, MSN, NP-C 08/14/2019, 12:58 PM [facility] [address] [place], [place] [address] [phone]

## 2019-08-14 NOTE — Progress Notes (Signed)
I have read the note, and I agree with the clinical assessment and plan.  Merlyn Conley K Kiah Keay   

## 2019-08-14 NOTE — Patient Instructions (Signed)
Your Plan:  Continue Imitrex PCP can managed Imitrex if amendable Follow-up with our office as needed or if symptoms worsen.  Thank you for coming to see [place] at [facility]. I hope we have been able to provide you high quality care today.  You may receive a patient satisfaction survey over the next few weeks. We would appreciate your feedback and comments so that we may continue to improve ourselves and the health of our patients.

## 2019-08-19 DIAGNOSIS — J309 Allergic rhinitis, unspecified: Secondary | ICD-10-CM | POA: Diagnosis not present

## 2019-08-19 DIAGNOSIS — G43909 Migraine, unspecified, not intractable, without status migrainosus: Secondary | ICD-10-CM | POA: Diagnosis not present

## 2019-08-19 DIAGNOSIS — K219 Gastro-esophageal reflux disease without esophagitis: Secondary | ICD-10-CM | POA: Diagnosis not present

## 2019-09-02 DIAGNOSIS — Z0000 Encounter for general adult medical examination without abnormal findings: Secondary | ICD-10-CM | POA: Diagnosis not present

## 2019-09-02 DIAGNOSIS — E782 Mixed hyperlipidemia: Secondary | ICD-10-CM | POA: Diagnosis not present

## 2019-09-03 DIAGNOSIS — Z1159 Encounter for screening for other viral diseases: Secondary | ICD-10-CM | POA: Diagnosis not present

## 2019-09-10 DIAGNOSIS — E782 Mixed hyperlipidemia: Secondary | ICD-10-CM | POA: Diagnosis not present

## 2019-09-10 DIAGNOSIS — I10 Essential (primary) hypertension: Secondary | ICD-10-CM | POA: Diagnosis not present

## 2019-09-10 DIAGNOSIS — Z0000 Encounter for general adult medical examination without abnormal findings: Secondary | ICD-10-CM | POA: Diagnosis not present

## 2019-09-10 DIAGNOSIS — Z6826 Body mass index (BMI) 26.0-26.9, adult: Secondary | ICD-10-CM | POA: Diagnosis not present

## 2019-09-10 DIAGNOSIS — J45909 Unspecified asthma, uncomplicated: Secondary | ICD-10-CM | POA: Diagnosis not present

## 2019-09-10 DIAGNOSIS — J309 Allergic rhinitis, unspecified: Secondary | ICD-10-CM | POA: Diagnosis not present

## 2019-10-01 DIAGNOSIS — E782 Mixed hyperlipidemia: Secondary | ICD-10-CM | POA: Diagnosis not present

## 2019-10-01 DIAGNOSIS — R739 Hyperglycemia, unspecified: Secondary | ICD-10-CM | POA: Diagnosis not present

## 2019-10-08 DIAGNOSIS — I10 Essential (primary) hypertension: Secondary | ICD-10-CM | POA: Diagnosis not present

## 2019-10-08 DIAGNOSIS — G43909 Migraine, unspecified, not intractable, without status migrainosus: Secondary | ICD-10-CM | POA: Diagnosis not present

## 2019-10-08 DIAGNOSIS — J309 Allergic rhinitis, unspecified: Secondary | ICD-10-CM | POA: Diagnosis not present

## 2019-11-22 ENCOUNTER — Encounter: Payer: Self-pay | Admitting: Family Medicine

## 2019-12-20 ENCOUNTER — Encounter: Payer: Self-pay | Admitting: Gastroenterology

## 2020-01-20 ENCOUNTER — Encounter: Payer: BLUE CROSS/BLUE SHIELD | Admitting: Gastroenterology

## 2020-01-27 ENCOUNTER — Other Ambulatory Visit: Payer: Self-pay

## 2020-01-27 ENCOUNTER — Ambulatory Visit (AMBULATORY_SURGERY_CENTER): Payer: Self-pay | Admitting: *Deleted

## 2020-01-27 VITALS — Temp 97.0°F | Ht 62.0 in | Wt 136.8 lb

## 2020-01-27 DIAGNOSIS — Z1211 Encounter for screening for malignant neoplasm of colon: Secondary | ICD-10-CM

## 2020-01-27 MED ORDER — SUPREP BOWEL PREP KIT 17.5-3.13-1.6 GM/177ML PO SOLN: 1.0000 | Freq: Once | ORAL | 0 refills | Status: AC

## 2020-01-27 NOTE — Progress Notes (Signed)
Patient denies any allergies to egg or soy products. Patient denies complications with anesthesia/sedation.  Patient denies oxygen use at home and denies diet medications. Emmi instructions for colonoscopy explained and given to patient.  Suprep coupon given at PV appt.   

## 2020-01-29 DIAGNOSIS — Z3041 Encounter for surveillance of contraceptive pills: Secondary | ICD-10-CM | POA: Diagnosis not present

## 2020-01-29 DIAGNOSIS — I10 Essential (primary) hypertension: Secondary | ICD-10-CM | POA: Diagnosis not present

## 2020-01-29 DIAGNOSIS — N926 Irregular menstruation, unspecified: Secondary | ICD-10-CM | POA: Diagnosis not present

## 2020-01-29 DIAGNOSIS — G43909 Migraine, unspecified, not intractable, without status migrainosus: Secondary | ICD-10-CM | POA: Diagnosis not present

## 2020-02-10 ENCOUNTER — Other Ambulatory Visit: Payer: Self-pay

## 2020-02-10 ENCOUNTER — Encounter: Payer: Self-pay | Admitting: Gastroenterology

## 2020-02-10 ENCOUNTER — Ambulatory Visit (AMBULATORY_SURGERY_CENTER): Payer: BC Managed Care – PPO | Admitting: Gastroenterology

## 2020-02-10 VITALS — BP 97/55 | HR 67 | Temp 97.3°F | Resp 13 | Ht 62.0 in | Wt 136.8 lb

## 2020-02-10 DIAGNOSIS — Z1211 Encounter for screening for malignant neoplasm of colon: Secondary | ICD-10-CM

## 2020-02-10 DIAGNOSIS — Z800 Family history of malignant neoplasm of digestive organs: Secondary | ICD-10-CM

## 2020-02-10 HISTORY — PX: COLONOSCOPY: SHX174

## 2020-02-10 MED ORDER — SODIUM CHLORIDE 0.9 % IV SOLN: 500.0000 mL | Freq: Once | INTRAVENOUS | Status: DC

## 2020-02-10 NOTE — Progress Notes (Signed)
Report given to PACU, vss 

## 2020-02-10 NOTE — Progress Notes (Signed)
Pt's states no medical or surgical changes since previsit or office visit.  Temp JB Vitals KA 

## 2020-02-10 NOTE — Op Note (Signed)
[facility] Patient Name: [name] Procedure Date: 02/10/2020 10:59 AM MRN: [id number] Endoscopist: Tressia Danas MD, MD Age: 49 Referring MD:  Date of Birth: 1971-09-21 Gender: Female Account #: [id number] Procedure:                Colonoscopy Indications:              Screening for colorectal malignant neoplasm, This                            is the patient's first colonoscopy                           No first degree family members with colon cancer                           Maternal grandfather with colon cancer over age 27                           Maternal cousin with colon cancer Medicines:                Monitored Anesthesia Care Procedure:                Pre-Anesthesia Assessment:                           - Prior to the procedure, a History and Physical                            was performed, and patient medications and                            allergies were reviewed. The patient's tolerance of                            previous anesthesia was also reviewed. The risks                            and benefits of the procedure and the sedation                            options and risks were discussed with the patient.                            All questions were answered, and informed consent                            was obtained. Prior Anticoagulants: The patient has                            taken no previous anticoagulant or antiplatelet                            agents. ASA Grade Assessment: II - A patient with  mild systemic disease. After reviewing the risks                            and benefits, the patient was deemed in                            satisfactory condition to undergo the procedure.                           After obtaining informed consent, the colonoscope                            was passed under direct vision. Throughout the                            procedure, the patient's blood  pressure, pulse, and                            oxygen saturations were monitored continuously. The                            Colonoscope was introduced through the anus and                            advanced to the 4 cm into the ileum. A second                            forward view of the right colon was performed. The                            colonoscopy was performed without difficulty. The                            patient tolerated the procedure well. The quality                            of the bowel preparation was good. The terminal                            ileum, ileocecal valve, appendiceal orifice, and                            rectum were photographed. Scope In: 11:12:48 AM Scope Out: 11:22:47 AM Scope Withdrawal Time: 0 hours 7 minutes 46 seconds  Total Procedure Duration: 0 hours 9 minutes 59 seconds  Findings:                 Non-bleeding external and internal hemorrhoids were                            found. The hemorrhoids were small.                           A few small-mouthed diverticula were found in the  sigmoid colon and descending colon.                           The exam was otherwise without abnormality on                            direct and retroflexion views. Complications:            No immediate complications. Estimated Blood Loss:     Estimated blood loss: none. Impression:               - Non-bleeding external and internal hemorrhoids.                           - Diverticulosis in the sigmoid colon and in the                            descending colon.                           - The examination was otherwise normal on direct                            and retroflexion views.                           - No specimens collected. Recommendation:           - Patient has a contact number available for                            emergencies. The signs and symptoms of potential                            delayed  complications were discussed with the                            patient. Return to normal activities tomorrow.                            Written discharge instructions were provided to the                            patient.                           - Continue present medications.                           - Repeat colonoscopy in 10 years for screening                            purposes, earlier with any new symptoms.                           - Follow a high fiber diet. Drink at least 64  ounces of water daily. Add a daily stool bulking                            agent such as psyllium (an exampled would be                            Metamucil).                           - Emerging evidence supports eating a diet of                            fruits, vegetables, grains, calcium, and yogurt                            while reducing red meat and alcohol may reduce the                            risk of colon cancer.                           - Thank you for allowing me to be involved in your                            colon cancer prevention. Thornton Park MD, MD 02/10/2020 11:30:51 AM This report has been signed electronically.

## 2020-02-10 NOTE — Patient Instructions (Signed)
Impression/Recommendations:  Hemorrhoid handout given to patient. Diverticulosis handout given to patient. High fiber diet handout given to patient. Drink at least 64 oz. Of water daily.  Add daily stool bulking agent such as Metamucil.  Continue present medications.  Repeat colonoscopy in 10 years for screening purposes.  YOU HAD AN ENDOSCOPIC PROCEDURE TODAY AT THE [facility]:   Refer to the procedure report that was given to you for any specific questions about what was found during the examination.  If the procedure report does not answer your questions, please call your gastroenterologist to clarify.  If you requested that your care partner not be given the details of your procedure findings, then the procedure report has been included in a sealed envelope for you to review at your convenience later.  YOU SHOULD EXPECT: Some feelings of bloating in the abdomen. Passage of more gas than usual.  Walking can help get rid of the air that was put into your GI tract during the procedure and reduce the bloating. If you had a lower endoscopy (such as a colonoscopy or flexible sigmoidoscopy) you may notice spotting of blood in your stool or on the toilet paper. If you underwent a bowel prep for your procedure, you may not have a normal bowel movement for a few days.  Please Note:  You might notice some irritation and congestion in your nose or some drainage.  This is from the oxygen used during your procedure.  There is no need for concern and it should clear up in a day or so.  SYMPTOMS TO REPORT IMMEDIATELY:   Following lower endoscopy (colonoscopy or flexible sigmoidoscopy):  Excessive amounts of blood in the stool  Significant tenderness or worsening of abdominal pains  Swelling of the abdomen that is new, acute  Fever of 100F or higher For urgent or emergent issues, a gastroenterologist can be reached at any hour by calling [phone]. Do not use MyChart messaging for  urgent concerns.    DIET:  We do recommend a small meal at first, but then you may proceed to your regular diet.  Drink plenty of fluids but you should avoid alcoholic beverages for 24 hours.  ACTIVITY:  You should plan to take it easy for the rest of today and you should NOT DRIVE or use heavy machinery until tomorrow (because of the sedation medicines used during the test).    FOLLOW UP: Our staff will call the number listed on your records 48-72 hours following your procedure to check on you and address any questions or concerns that you may have regarding the information given to you following your procedure. If we do not reach you, we will leave a message.  We will attempt to reach you two times.  During this call, we will ask if you have developed any symptoms of COVID 19. If you develop any symptoms (ie: fever, flu-like symptoms, shortness of breath, cough etc.) before then, please call [phone].  If you test positive for Covid 19 in the 2 weeks post procedure, please call and report this information to [place].    If any biopsies were taken you will be contacted by phone or by letter within the next 1-3 weeks.  Please call us at [phone] if you have not heard about the biopsies in 3 weeks.    SIGNATURES/CONFIDENTIALITY: You and/or your care partner have signed paperwork which will be entered into your electronic medical record.  These signatures attest to the fact that that  the information above on your After Visit Summary has been reviewed and is understood.  Full responsibility of the confidentiality of this discharge information lies with you and/or your care-partner.

## 2020-02-12 ENCOUNTER — Telehealth: Payer: Self-pay | Admitting: *Deleted

## 2020-02-12 NOTE — Telephone Encounter (Signed)
  Follow up Call-  Call back number 02/10/2020  Post procedure Call Back phone  # [phone]  Permission to leave phone message Yes  Some recent data might be hidden     Patient questions:  Do you have a fever, pain , or abdominal swelling? Abdominal pain Pain Score  4 -5 at times  Have you tolerated food without any problems? Yes.    Have you been able to return to your normal activities? Yes.    Do you have any questions about your discharge instructions: Diet   No. Medications  No. Follow up visit  No.  Do you have questions or concerns about your Care? Yes.   Pt c/o abdominal pain/bloating right and left sided this morning and intermittent today. No bleeding. She also has started spotting on the "pill" and is not sure if this is related to that. Encouraged pt to try Gas-X or simethicone. Will notify Dr. Orvan Falconer. Actions: * If pain score is 4 or above: Physician/ provider Notified : Cala Bradford L. Orvan Falconer, MD.

## 2020-02-12 NOTE — Telephone Encounter (Signed)
Message left

## 2020-02-12 NOTE — Telephone Encounter (Signed)
  Follow up Call-  Call back number 02/10/2020  Post procedure Call Back phone  # [phone]  Permission to leave phone message Yes  Some recent data might be hidden     No answer at # given.  LM on VM.

## 2020-02-12 NOTE — Telephone Encounter (Signed)
Pt returned your call.  She stated that she is experiencing abd pain.

## 2020-02-12 NOTE — Telephone Encounter (Signed)
Thank you for the follow-up. Please call the patient 02/13/20 to see how she is feeling.

## 2020-02-13 NOTE — Telephone Encounter (Signed)
Spoke with pt and she states she is feeling better, she knows to call us back if she has any other problems.

## 2020-02-13 NOTE — Telephone Encounter (Signed)
Attempted to call pt to see if abdominal pain had resolved.  LM on VM for patient to call back if needed.

## 2020-02-17 DIAGNOSIS — H5213 Myopia, bilateral: Secondary | ICD-10-CM | POA: Diagnosis not present

## 2020-02-28 DIAGNOSIS — K219 Gastro-esophageal reflux disease without esophagitis: Secondary | ICD-10-CM | POA: Diagnosis not present

## 2020-02-28 DIAGNOSIS — I10 Essential (primary) hypertension: Secondary | ICD-10-CM | POA: Diagnosis not present

## 2020-02-28 DIAGNOSIS — G43909 Migraine, unspecified, not intractable, without status migrainosus: Secondary | ICD-10-CM | POA: Diagnosis not present

## 2020-02-28 DIAGNOSIS — J302 Other seasonal allergic rhinitis: Secondary | ICD-10-CM | POA: Diagnosis not present

## 2020-05-07 ENCOUNTER — Other Ambulatory Visit: Payer: Self-pay | Admitting: Obstetrics and Gynecology

## 2020-05-07 DIAGNOSIS — Z1231 Encounter for screening mammogram for malignant neoplasm of breast: Secondary | ICD-10-CM

## 2020-05-09 IMAGING — MG DIGITAL SCREENING BILATERAL MAMMOGRAM WITH TOMO AND CAD
8 series · 8 of 24 positions shown · non-contrast
Comparison: Previous exam(s).

CLINICAL DATA: Screening.

EXAM:
DIGITAL SCREENING BILATERAL MAMMOGRAM WITH TOMO AND CAD

[R CC synth-2D]
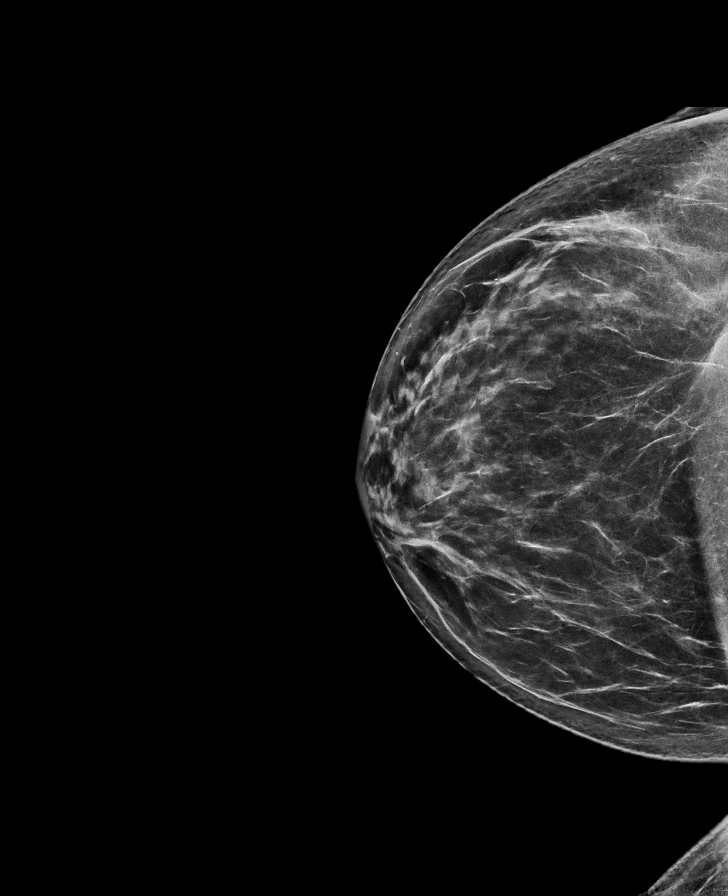

[R MLO synth-2D]
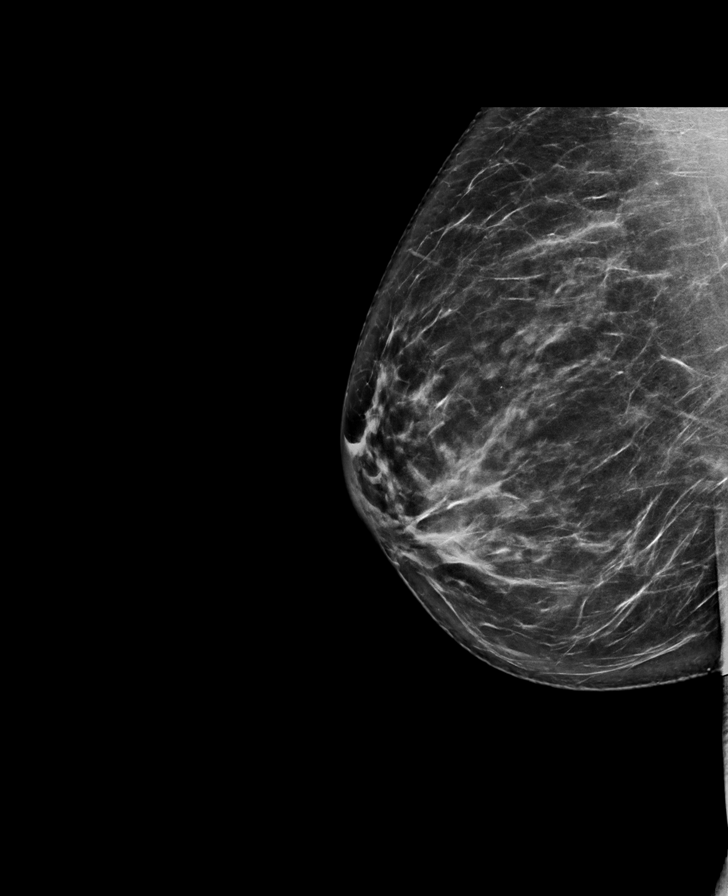

[L CC synth-2D]
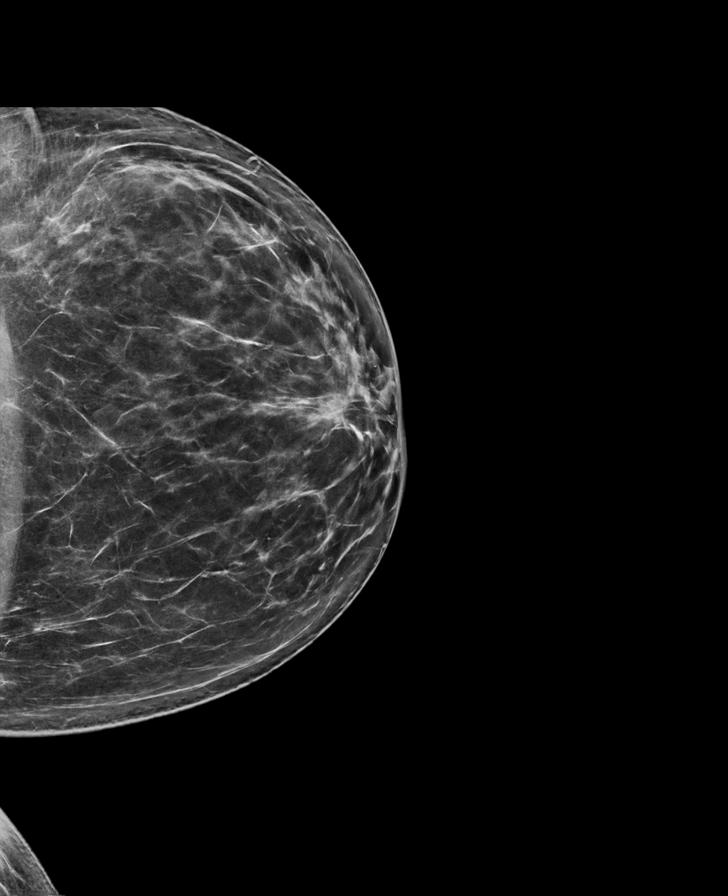

[L MLO synth-2D]
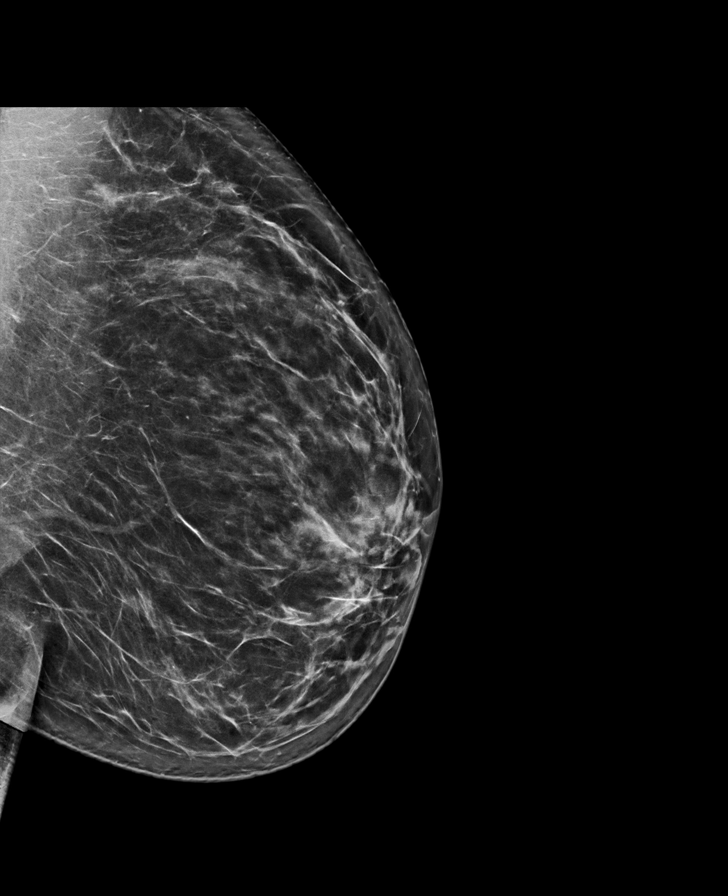

[L CC tomo · tomo slice 43/86.0]
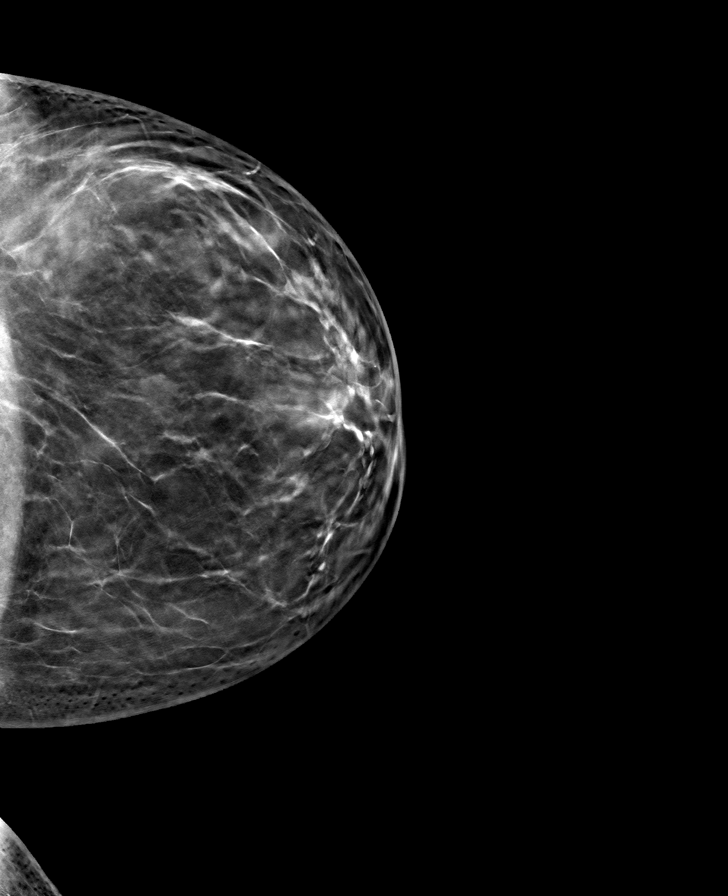

[R CC tomo · tomo slice 43/85.0]
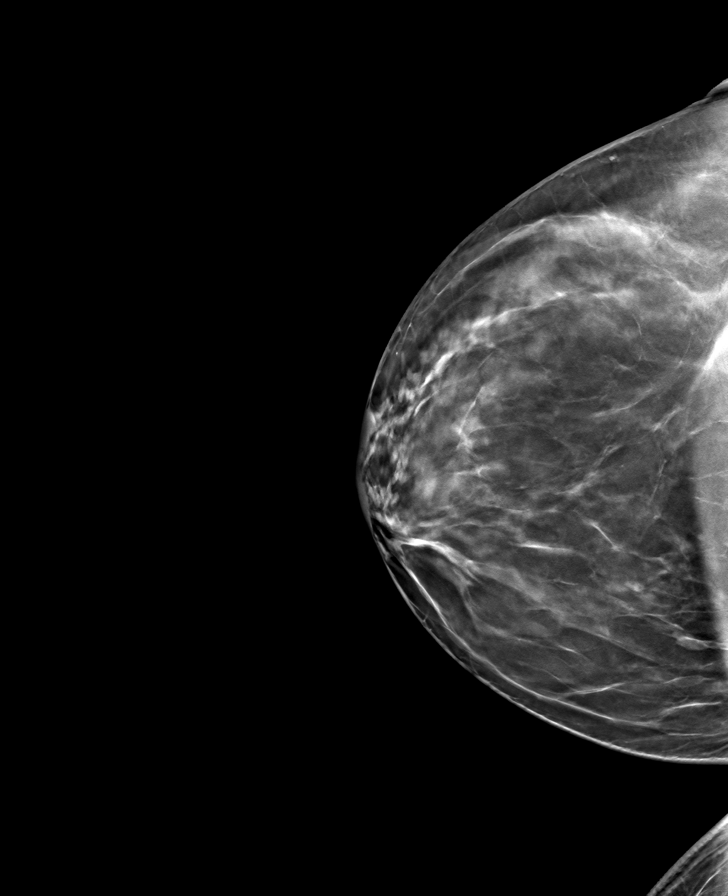

[L MLO tomo · tomo slice 41/82.0]
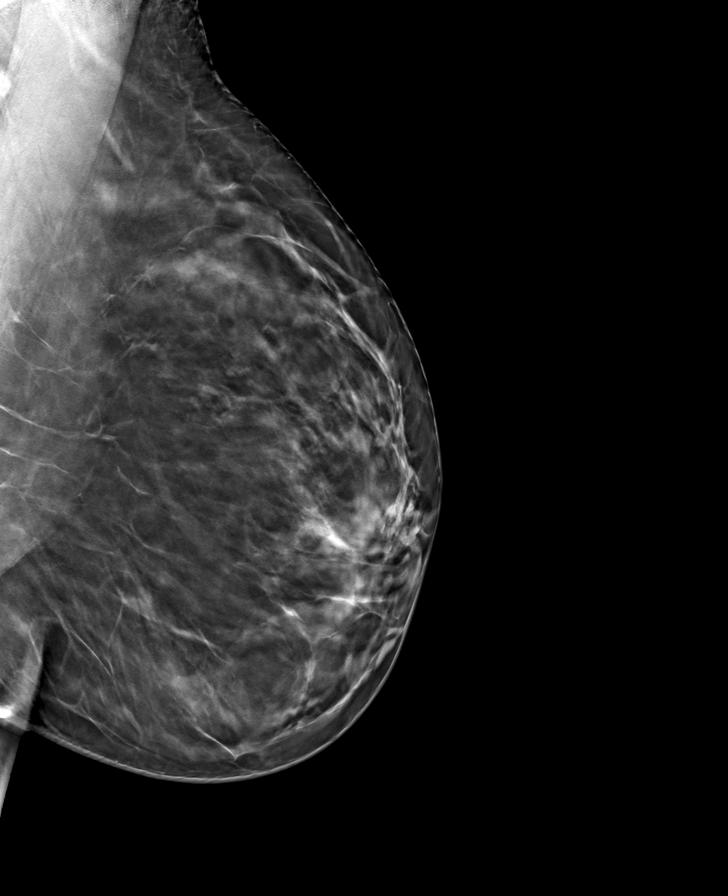

[R MLO tomo · tomo slice 42/83.0]
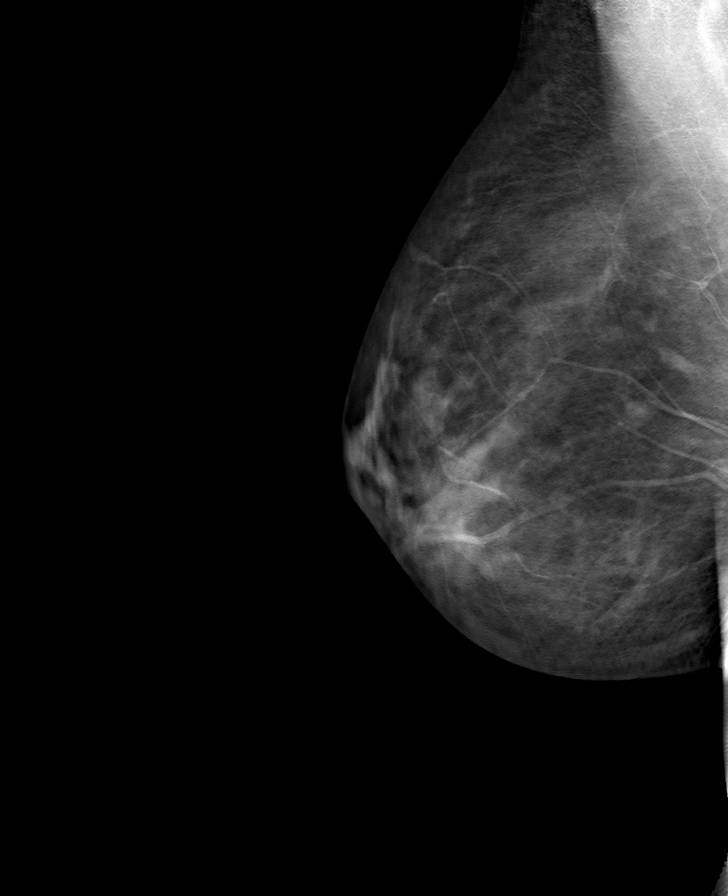

[8 of 24 positions shown; findings below may reference images not displayed]

ACR Breast Density Category b: There are scattered areas of
fibroglandular density.
FINDINGS: In the left breast, a possible asymmetry warrants further
evaluation. In the right breast, no findings suspicious for
malignancy. Images were processed with CAD.
IMPRESSION: Further evaluation is suggested for possible asymmetry in the left
breast.

RECOMMENDATION:
Diagnostic mammogram and possibly ultrasound of the left breast.
(Code:DI-[id number])

The patient will be contacted regarding the findings, and additional
imaging will be scheduled.

BI-RADS CATEGORY  0: Incomplete. Need additional imaging evaluation
and/or prior mammograms for comparison.

## 2020-05-16 IMAGING — US ULTRASOUND LEFT BREAST LIMITED
1 series · 5 of 5 positions shown · non-contrast
Comparison: Previous exam(s).

CLINICAL DATA: Patient recalled from screening for left breast
asymmetry.

EXAM:
DIGITAL DIAGNOSTIC LEFT MAMMOGRAM WITH CAD AND TOMO
ULTRASOUND LEFT BREAST

[Series 1: ultrasound left breast limited · 0.08mm/px · 5 of 5 slices shown]
[im 1/5]
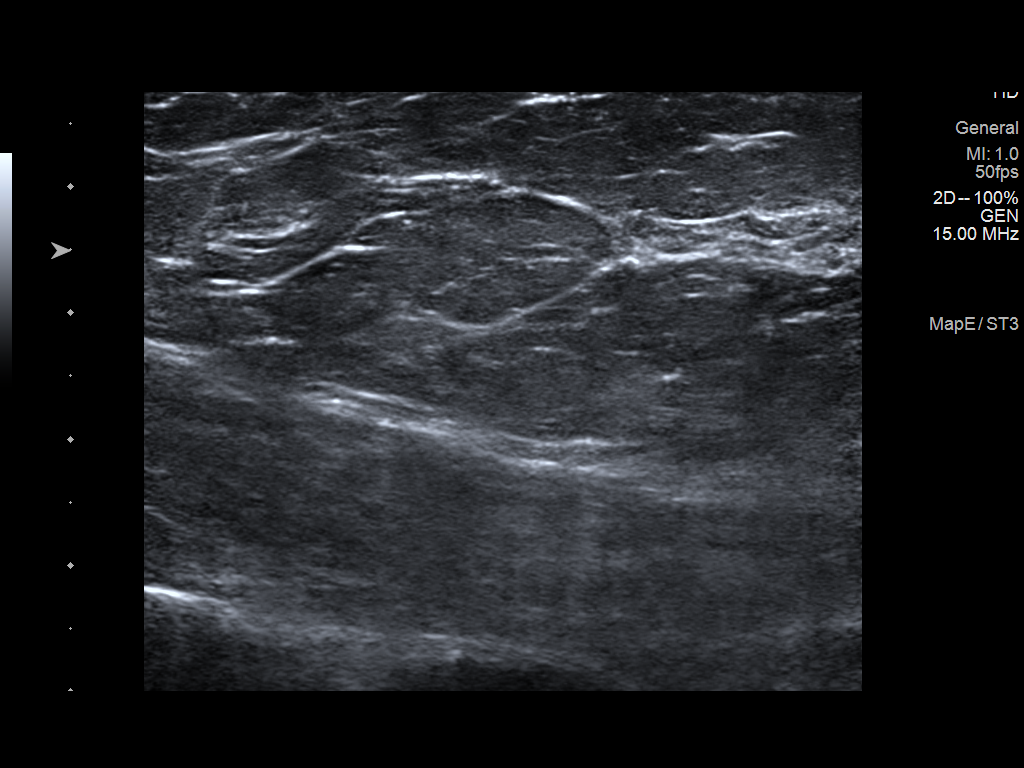
[im 2/5]
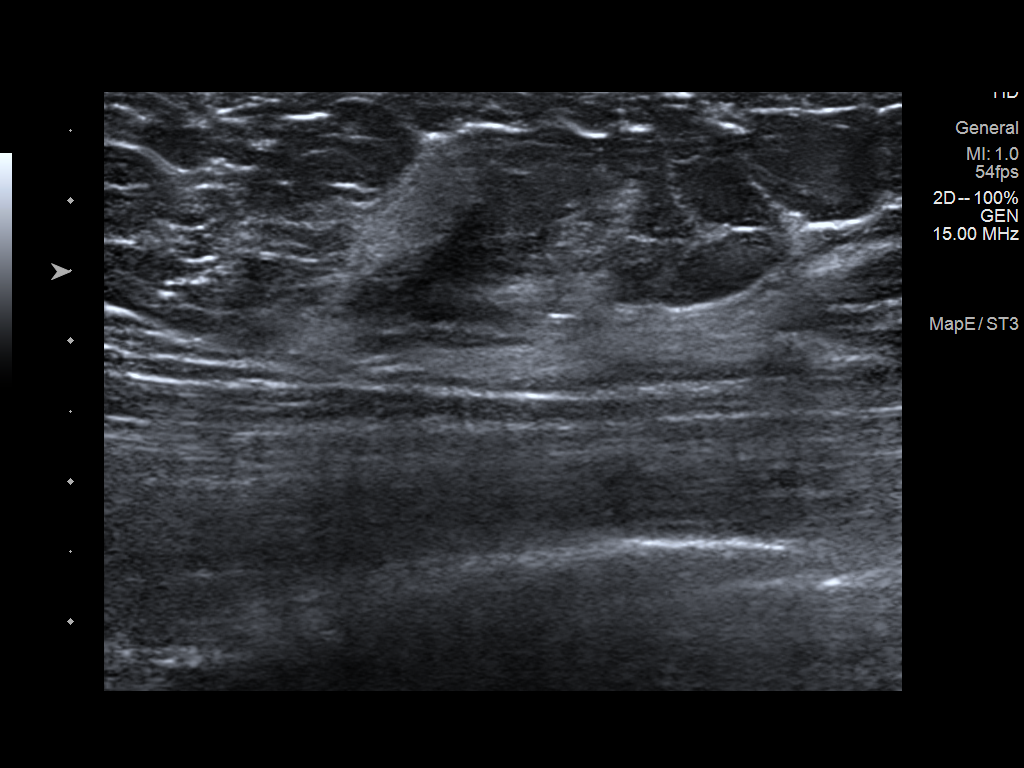
[im 3/5]
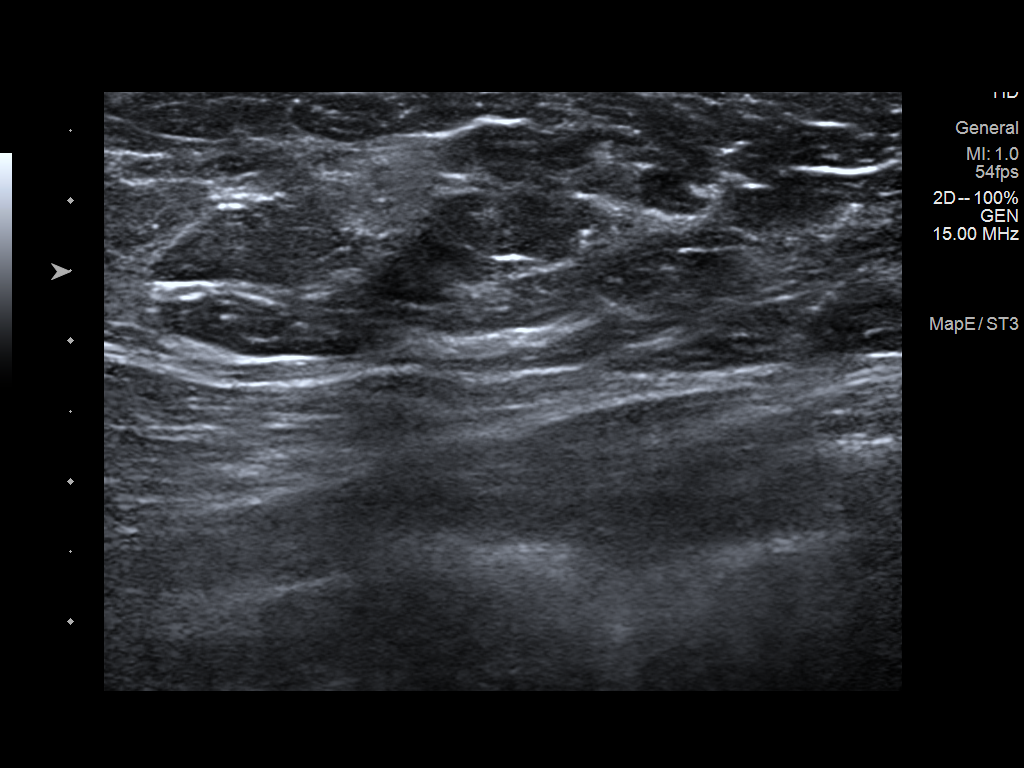
[im 4/5]
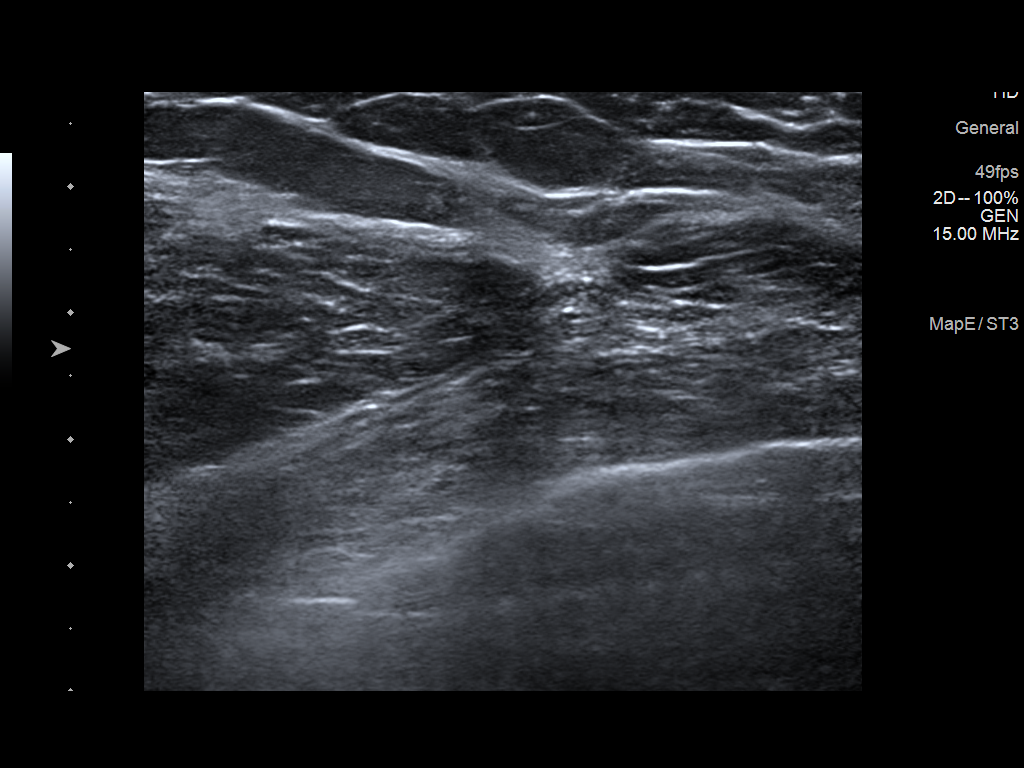
[im 5/5]
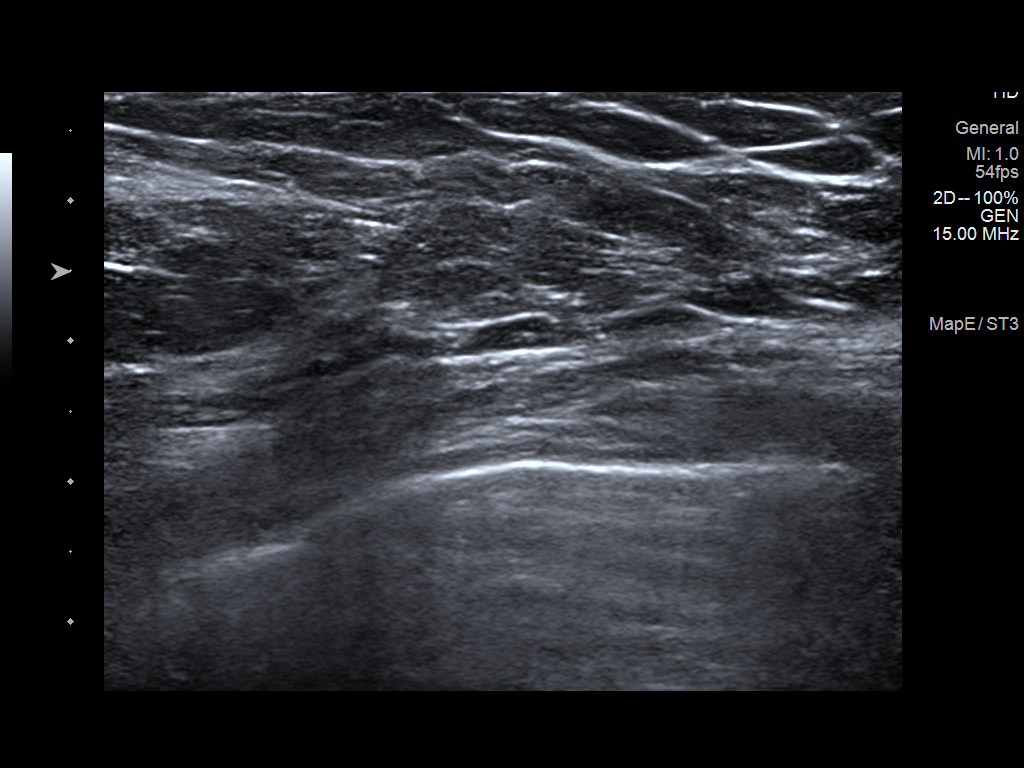

[5 of 5 positions shown; findings below may reference images not displayed]

ACR Breast Density Category b: There are scattered areas of
fibroglandular density.
FINDINGS: Questioned asymmetry within the upper-outer left breast
predominately effaced with additional imaging suggestive of dense
fibroglandular tissue.

Mammographic images were processed with CAD.

Targeted ultrasound is performed, showing dense tissue without
suspicious mass within the upper-outer left breast.
IMPRESSION: No mammographic evidence for malignancy.

RECOMMENDATION:
Screening mammogram in one year.(Code:SP-V-KGB)

I have discussed the findings and recommendations with the patient.
Results were also provided in writing at the conclusion of the
visit. If applicable, a reminder letter will be sent to the patient
regarding the next appointment.

BI-RADS CATEGORY  1: Negative.

## 2020-05-16 IMAGING — MG DIGITAL DIAGNOSTIC UNILATERAL LEFT MAMMOGRAM WITH TOMO AND CAD
3 series · 3 of 11 positions shown · non-contrast
Comparison: Previous exam(s).

CLINICAL DATA: Patient recalled from screening for left breast
asymmetry.

EXAM:
DIGITAL DIAGNOSTIC LEFT MAMMOGRAM WITH CAD AND TOMO
ULTRASOUND LEFT BREAST

[L MLO synth-2D]
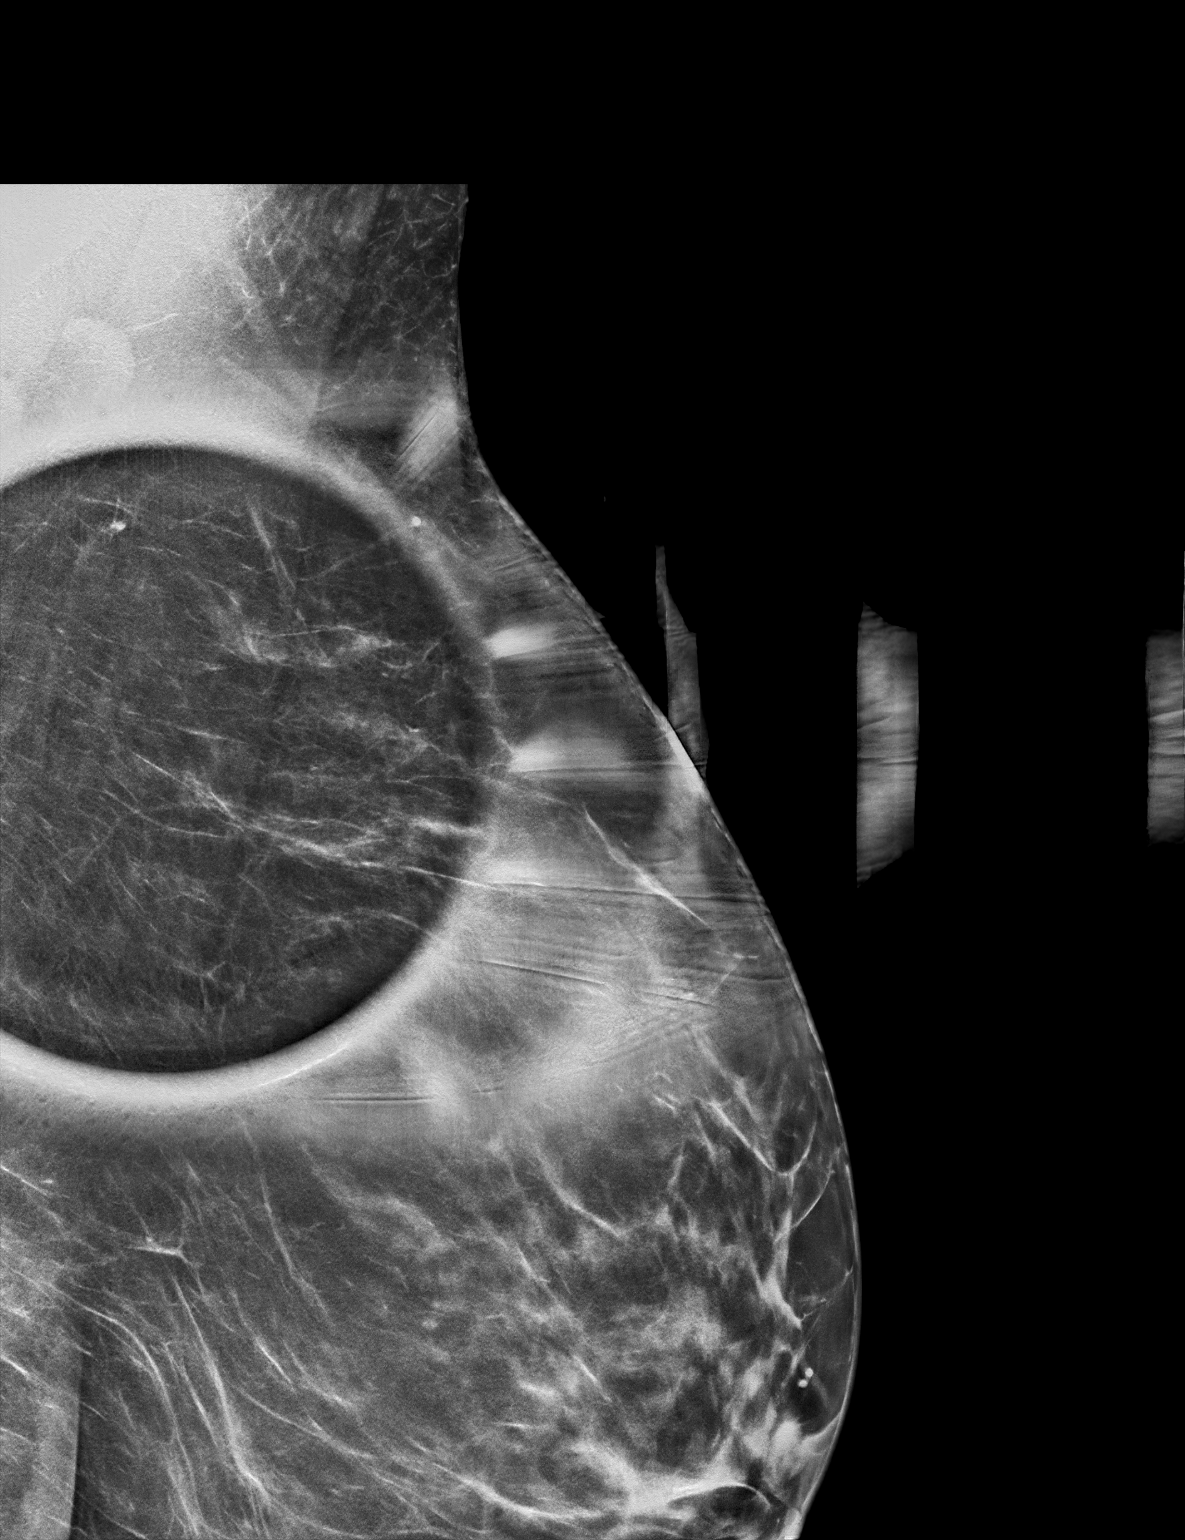

[L ML tomo · tomo slice 43/84.0]
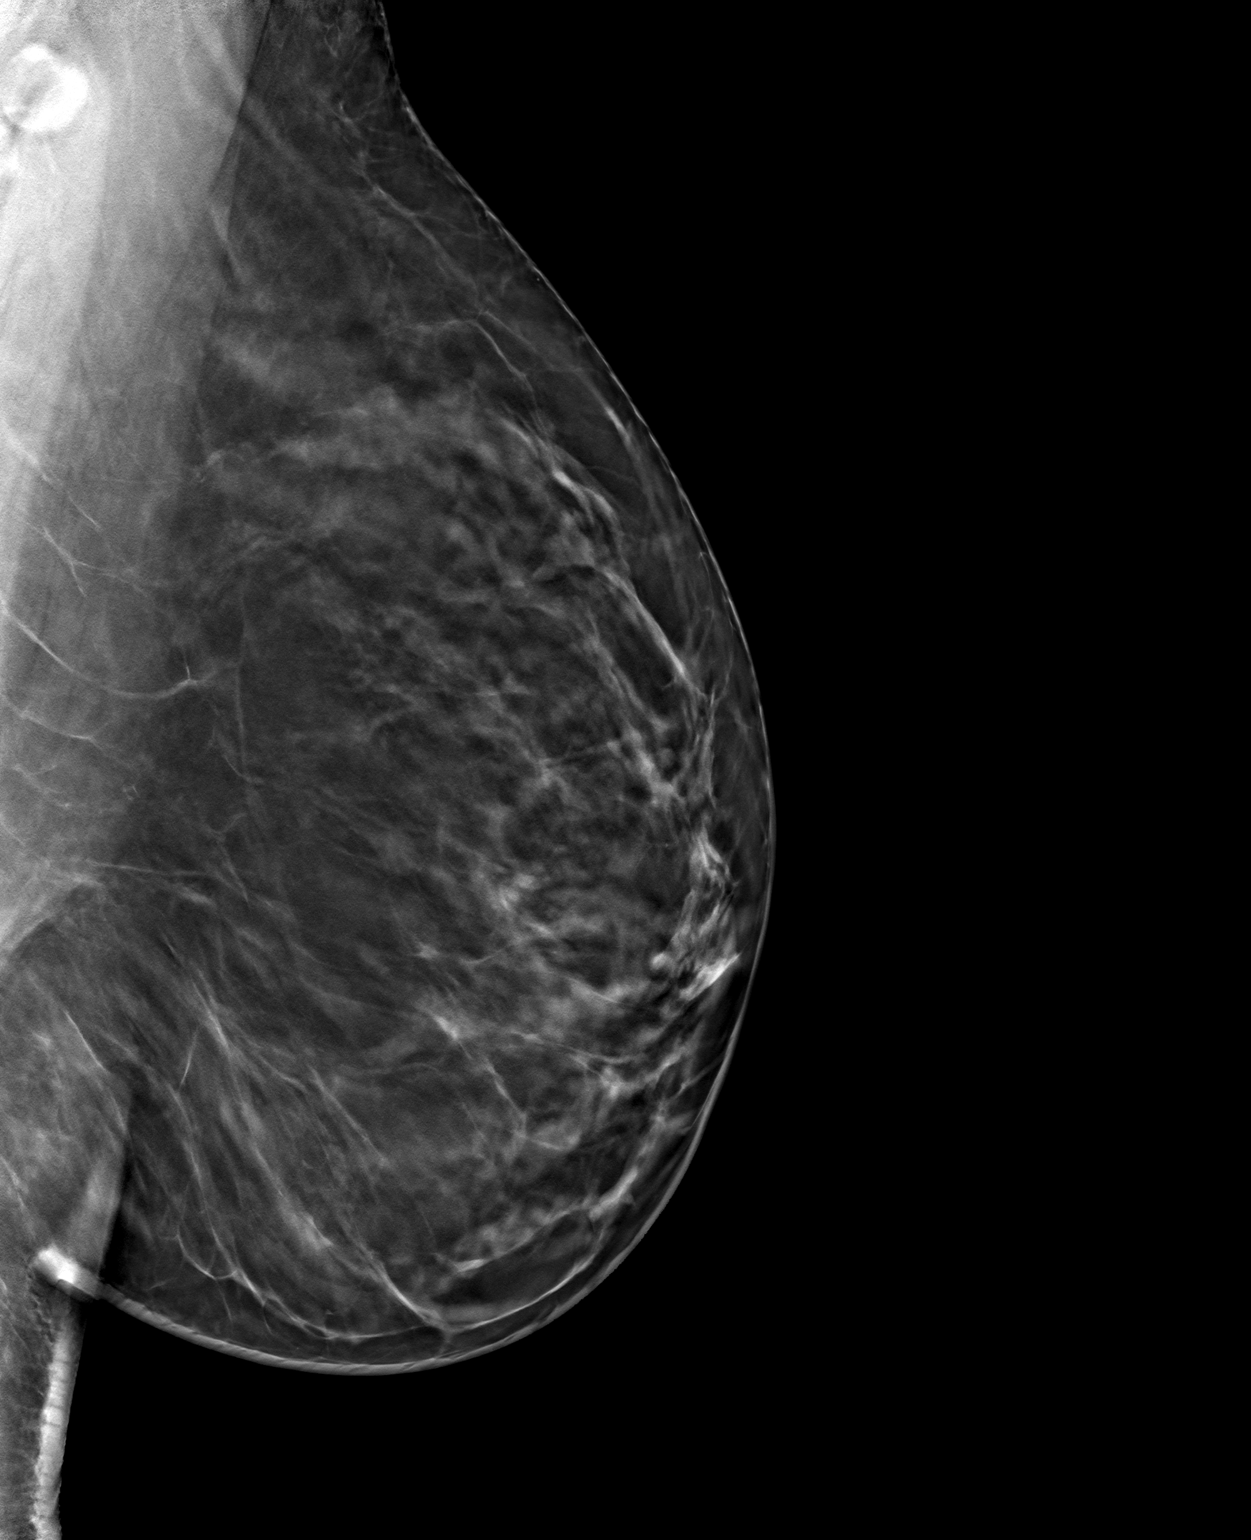

[L MLO tomo · tomo slice 40/79.0]
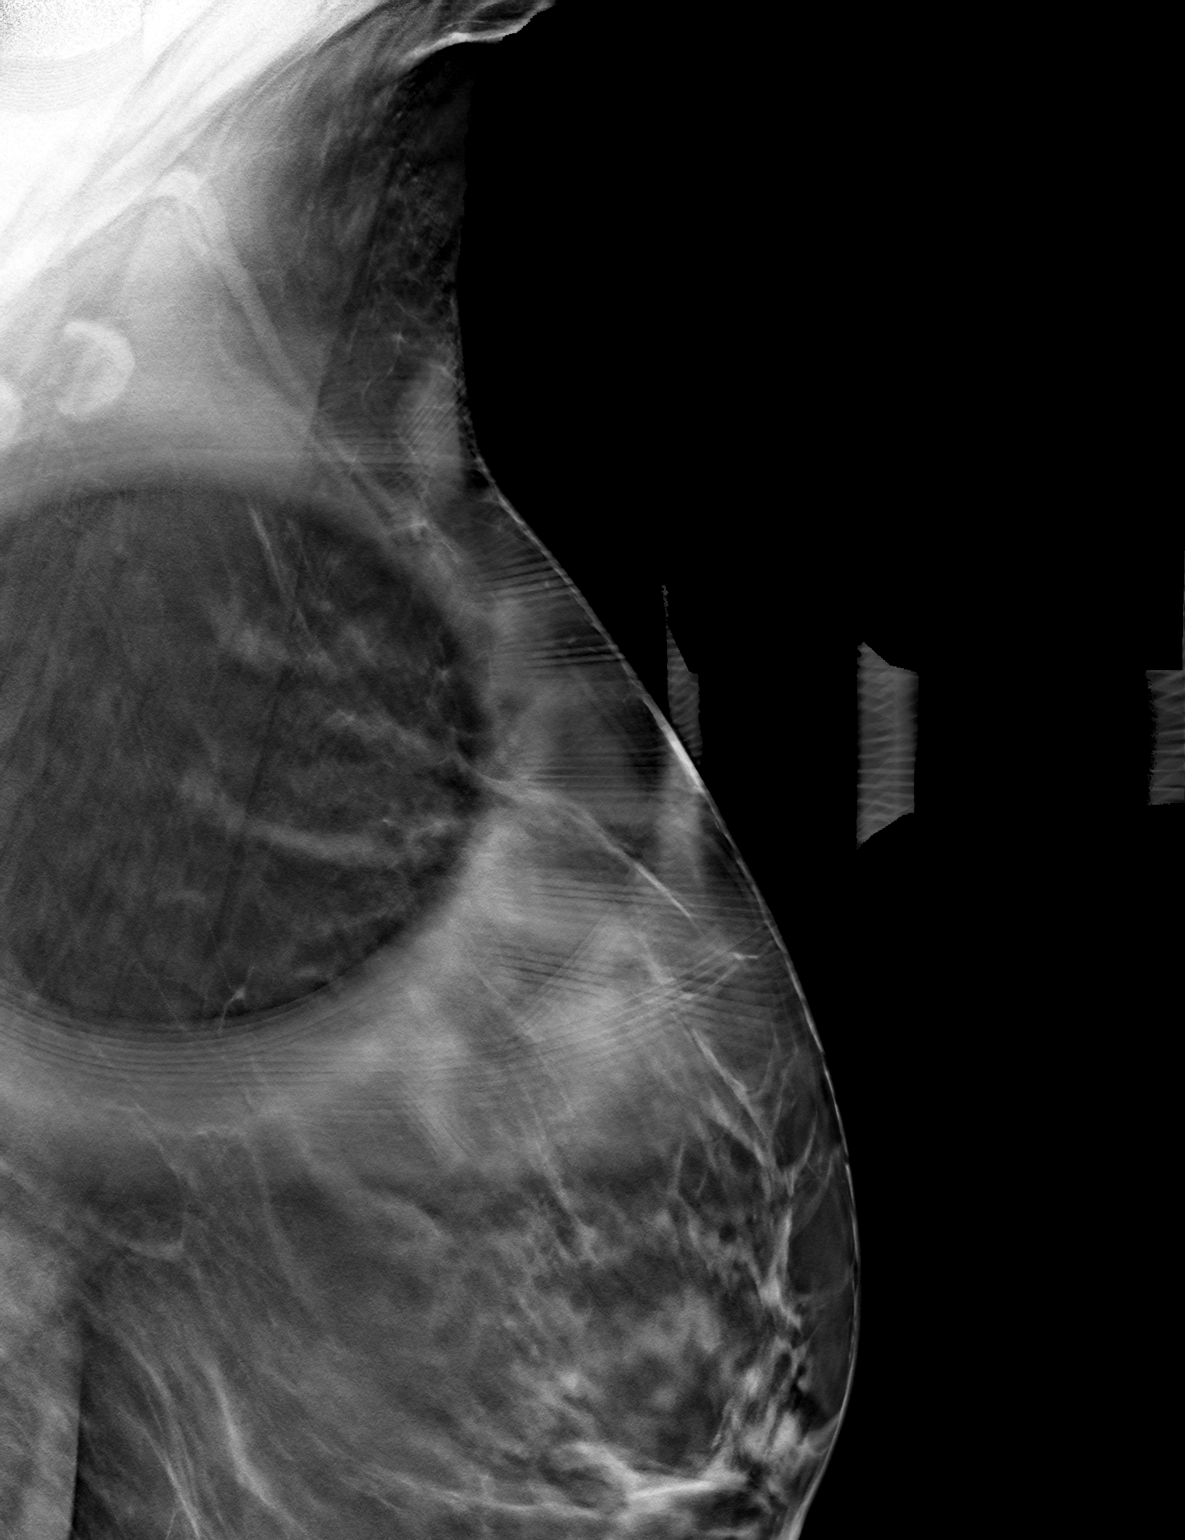

[3 of 11 positions shown; findings below may reference images not displayed]

ACR Breast Density Category b: There are scattered areas of
fibroglandular density.
FINDINGS: Questioned asymmetry within the upper-outer left breast
predominately effaced with additional imaging suggestive of dense
fibroglandular tissue.

Mammographic images were processed with CAD.

Targeted ultrasound is performed, showing dense tissue without
suspicious mass within the upper-outer left breast.
IMPRESSION: No mammographic evidence for malignancy.

RECOMMENDATION:
Screening mammogram in one year.(Code:SP-V-KGB)

I have discussed the findings and recommendations with the patient.
Results were also provided in writing at the conclusion of the
visit. If applicable, a reminder letter will be sent to the patient
regarding the next appointment.

BI-RADS CATEGORY  1: Negative.

## 2020-06-03 DIAGNOSIS — M9901 Segmental and somatic dysfunction of cervical region: Secondary | ICD-10-CM | POA: Diagnosis not present

## 2020-06-03 DIAGNOSIS — M9904 Segmental and somatic dysfunction of sacral region: Secondary | ICD-10-CM | POA: Diagnosis not present

## 2020-06-03 DIAGNOSIS — M9902 Segmental and somatic dysfunction of thoracic region: Secondary | ICD-10-CM | POA: Diagnosis not present

## 2020-06-03 DIAGNOSIS — M9903 Segmental and somatic dysfunction of lumbar region: Secondary | ICD-10-CM | POA: Diagnosis not present

## 2020-06-08 ENCOUNTER — Other Ambulatory Visit: Payer: Self-pay

## 2020-06-08 ENCOUNTER — Ambulatory Visit
Admission: RE | Admit: 2020-06-08 | Discharge: 2020-06-08 | Disposition: A | Discharge status: Home / Self Care | Payer: BC Managed Care – PPO | Source: Ambulatory Visit | Attending: Obstetrics and Gynecology | Admitting: Obstetrics and Gynecology

## 2020-06-08 DIAGNOSIS — Z1231 Encounter for screening mammogram for malignant neoplasm of breast: Secondary | ICD-10-CM

## 2020-06-23 DIAGNOSIS — M9904 Segmental and somatic dysfunction of sacral region: Secondary | ICD-10-CM | POA: Diagnosis not present

## 2020-06-23 DIAGNOSIS — M9901 Segmental and somatic dysfunction of cervical region: Secondary | ICD-10-CM | POA: Diagnosis not present

## 2020-06-23 DIAGNOSIS — M9903 Segmental and somatic dysfunction of lumbar region: Secondary | ICD-10-CM | POA: Diagnosis not present

## 2020-06-23 DIAGNOSIS — M9902 Segmental and somatic dysfunction of thoracic region: Secondary | ICD-10-CM | POA: Diagnosis not present

## 2020-07-10 DIAGNOSIS — Z01419 Encounter for gynecological examination (general) (routine) without abnormal findings: Secondary | ICD-10-CM | POA: Diagnosis not present

## 2020-07-10 DIAGNOSIS — Z6823 Body mass index (BMI) 23.0-23.9, adult: Secondary | ICD-10-CM | POA: Diagnosis not present

## 2020-07-13 DIAGNOSIS — Z30430 Encounter for insertion of intrauterine contraceptive device: Secondary | ICD-10-CM | POA: Diagnosis not present

## 2020-08-25 DIAGNOSIS — Z30431 Encounter for routine checking of intrauterine contraceptive device: Secondary | ICD-10-CM | POA: Diagnosis not present

## 2020-09-14 DIAGNOSIS — Z0000 Encounter for general adult medical examination without abnormal findings: Secondary | ICD-10-CM | POA: Diagnosis not present

## 2020-09-14 DIAGNOSIS — E782 Mixed hyperlipidemia: Secondary | ICD-10-CM | POA: Diagnosis not present

## 2020-09-16 DIAGNOSIS — M7052 Other bursitis of knee, left knee: Secondary | ICD-10-CM | POA: Diagnosis not present

## 2020-09-16 DIAGNOSIS — Z0000 Encounter for general adult medical examination without abnormal findings: Secondary | ICD-10-CM | POA: Diagnosis not present

## 2020-10-22 DIAGNOSIS — J45901 Unspecified asthma with (acute) exacerbation: Secondary | ICD-10-CM | POA: Diagnosis not present

## 2020-10-22 DIAGNOSIS — Z20822 Contact with and (suspected) exposure to covid-19: Secondary | ICD-10-CM | POA: Diagnosis not present

## 2020-10-22 DIAGNOSIS — J40 Bronchitis, not specified as acute or chronic: Secondary | ICD-10-CM | POA: Diagnosis not present

## 2020-10-22 DIAGNOSIS — J45909 Unspecified asthma, uncomplicated: Secondary | ICD-10-CM | POA: Diagnosis not present

## 2020-11-18 DIAGNOSIS — D224 Melanocytic nevi of scalp and neck: Secondary | ICD-10-CM | POA: Diagnosis not present

## 2020-11-18 DIAGNOSIS — D2261 Melanocytic nevi of right upper limb, including shoulder: Secondary | ICD-10-CM | POA: Diagnosis not present

## 2020-11-18 DIAGNOSIS — D2262 Melanocytic nevi of left upper limb, including shoulder: Secondary | ICD-10-CM | POA: Diagnosis not present

## 2020-11-18 DIAGNOSIS — D2221 Melanocytic nevi of right ear and external auricular canal: Secondary | ICD-10-CM | POA: Diagnosis not present

## 2021-01-18 DIAGNOSIS — I10 Essential (primary) hypertension: Secondary | ICD-10-CM | POA: Diagnosis not present

## 2021-01-18 DIAGNOSIS — J309 Allergic rhinitis, unspecified: Secondary | ICD-10-CM | POA: Diagnosis not present

## 2021-01-18 DIAGNOSIS — K219 Gastro-esophageal reflux disease without esophagitis: Secondary | ICD-10-CM | POA: Diagnosis not present

## 2021-01-18 DIAGNOSIS — J4520 Mild intermittent asthma, uncomplicated: Secondary | ICD-10-CM | POA: Diagnosis not present

## 2021-02-19 DIAGNOSIS — H5213 Myopia, bilateral: Secondary | ICD-10-CM | POA: Diagnosis not present

## 2021-03-17 DIAGNOSIS — J45909 Unspecified asthma, uncomplicated: Secondary | ICD-10-CM | POA: Diagnosis not present

## 2021-03-17 DIAGNOSIS — B9689 Other specified bacterial agents as the cause of diseases classified elsewhere: Secondary | ICD-10-CM | POA: Diagnosis not present

## 2021-03-17 DIAGNOSIS — J329 Chronic sinusitis, unspecified: Secondary | ICD-10-CM | POA: Diagnosis not present

## 2021-03-19 DIAGNOSIS — J45909 Unspecified asthma, uncomplicated: Secondary | ICD-10-CM | POA: Diagnosis not present

## 2021-03-19 DIAGNOSIS — B9689 Other specified bacterial agents as the cause of diseases classified elsewhere: Secondary | ICD-10-CM | POA: Diagnosis not present

## 2021-03-19 DIAGNOSIS — J329 Chronic sinusitis, unspecified: Secondary | ICD-10-CM | POA: Diagnosis not present

## 2021-04-12 ENCOUNTER — Other Ambulatory Visit: Payer: Self-pay | Admitting: Family Medicine

## 2021-04-12 DIAGNOSIS — Z1231 Encounter for screening mammogram for malignant neoplasm of breast: Secondary | ICD-10-CM

## 2021-06-11 ENCOUNTER — Ambulatory Visit
Admission: RE | Admit: 2021-06-11 | Discharge: 2021-06-11 | Disposition: A | Discharge status: Home / Self Care | Payer: BC Managed Care – PPO | Source: Ambulatory Visit | Attending: Family Medicine | Admitting: Family Medicine

## 2021-06-11 ENCOUNTER — Other Ambulatory Visit: Payer: Self-pay

## 2021-06-11 DIAGNOSIS — Z1231 Encounter for screening mammogram for malignant neoplasm of breast: Secondary | ICD-10-CM

## 2021-08-11 DIAGNOSIS — Z01419 Encounter for gynecological examination (general) (routine) without abnormal findings: Secondary | ICD-10-CM | POA: Diagnosis not present

## 2021-08-11 DIAGNOSIS — Z6824 Body mass index (BMI) 24.0-24.9, adult: Secondary | ICD-10-CM | POA: Diagnosis not present

## 2021-09-21 DIAGNOSIS — Z0000 Encounter for general adult medical examination without abnormal findings: Secondary | ICD-10-CM | POA: Diagnosis not present

## 2021-12-02 DIAGNOSIS — Z0000 Encounter for general adult medical examination without abnormal findings: Secondary | ICD-10-CM | POA: Diagnosis not present

## 2021-12-02 DIAGNOSIS — Z23 Encounter for immunization: Secondary | ICD-10-CM | POA: Diagnosis not present

## 2021-12-16 DIAGNOSIS — J309 Allergic rhinitis, unspecified: Secondary | ICD-10-CM | POA: Diagnosis not present

## 2021-12-16 DIAGNOSIS — J329 Chronic sinusitis, unspecified: Secondary | ICD-10-CM | POA: Diagnosis not present

## 2021-12-16 DIAGNOSIS — B9689 Other specified bacterial agents as the cause of diseases classified elsewhere: Secondary | ICD-10-CM | POA: Diagnosis not present

## 2021-12-16 DIAGNOSIS — J45909 Unspecified asthma, uncomplicated: Secondary | ICD-10-CM | POA: Diagnosis not present

## 2021-12-20 DIAGNOSIS — B9689 Other specified bacterial agents as the cause of diseases classified elsewhere: Secondary | ICD-10-CM | POA: Diagnosis not present

## 2021-12-20 DIAGNOSIS — J329 Chronic sinusitis, unspecified: Secondary | ICD-10-CM | POA: Diagnosis not present

## 2021-12-20 DIAGNOSIS — J069 Acute upper respiratory infection, unspecified: Secondary | ICD-10-CM | POA: Diagnosis not present

## 2021-12-20 DIAGNOSIS — J309 Allergic rhinitis, unspecified: Secondary | ICD-10-CM | POA: Diagnosis not present

## 2021-12-21 DIAGNOSIS — J069 Acute upper respiratory infection, unspecified: Secondary | ICD-10-CM | POA: Diagnosis not present

## 2021-12-21 DIAGNOSIS — J40 Bronchitis, not specified as acute or chronic: Secondary | ICD-10-CM | POA: Diagnosis not present

## 2022-02-01 DIAGNOSIS — J45909 Unspecified asthma, uncomplicated: Secondary | ICD-10-CM | POA: Diagnosis not present

## 2022-02-01 DIAGNOSIS — E782 Mixed hyperlipidemia: Secondary | ICD-10-CM | POA: Diagnosis not present

## 2022-02-01 DIAGNOSIS — K219 Gastro-esophageal reflux disease without esophagitis: Secondary | ICD-10-CM | POA: Diagnosis not present

## 2022-02-01 DIAGNOSIS — I10 Essential (primary) hypertension: Secondary | ICD-10-CM | POA: Diagnosis not present

## 2022-03-02 DIAGNOSIS — H5213 Myopia, bilateral: Secondary | ICD-10-CM | POA: Diagnosis not present

## 2022-04-22 DIAGNOSIS — J069 Acute upper respiratory infection, unspecified: Secondary | ICD-10-CM | POA: Diagnosis not present

## 2022-04-22 DIAGNOSIS — J9801 Acute bronchospasm: Secondary | ICD-10-CM | POA: Diagnosis not present

## 2022-04-22 DIAGNOSIS — R059 Cough, unspecified: Secondary | ICD-10-CM | POA: Diagnosis not present

## 2022-04-22 DIAGNOSIS — R002 Palpitations: Secondary | ICD-10-CM | POA: Diagnosis not present

## 2022-04-22 DIAGNOSIS — Z20822 Contact with and (suspected) exposure to covid-19: Secondary | ICD-10-CM | POA: Diagnosis not present

## 2022-05-13 ENCOUNTER — Other Ambulatory Visit: Payer: Self-pay | Admitting: Obstetrics and Gynecology

## 2022-05-13 DIAGNOSIS — Z1231 Encounter for screening mammogram for malignant neoplasm of breast: Secondary | ICD-10-CM

## 2022-06-14 ENCOUNTER — Ambulatory Visit
Admission: RE | Admit: 2022-06-14 | Discharge: 2022-06-14 | Disposition: A | Discharge status: Home / Self Care | Payer: BC Managed Care – PPO | Source: Ambulatory Visit | Attending: Obstetrics and Gynecology | Admitting: Obstetrics and Gynecology

## 2022-06-14 DIAGNOSIS — Z1231 Encounter for screening mammogram for malignant neoplasm of breast: Secondary | ICD-10-CM

## 2022-07-25 DIAGNOSIS — Z23 Encounter for immunization: Secondary | ICD-10-CM | POA: Diagnosis not present

## 2022-08-05 DIAGNOSIS — Z23 Encounter for immunization: Secondary | ICD-10-CM | POA: Diagnosis not present

## 2022-08-18 DIAGNOSIS — Z6825 Body mass index (BMI) 25.0-25.9, adult: Secondary | ICD-10-CM | POA: Diagnosis not present

## 2022-08-18 DIAGNOSIS — Z01419 Encounter for gynecological examination (general) (routine) without abnormal findings: Secondary | ICD-10-CM | POA: Diagnosis not present

## 2022-08-18 DIAGNOSIS — Z124 Encounter for screening for malignant neoplasm of cervix: Secondary | ICD-10-CM | POA: Diagnosis not present

## 2022-12-07 DIAGNOSIS — Z114 Encounter for screening for human immunodeficiency virus [HIV]: Secondary | ICD-10-CM | POA: Diagnosis not present

## 2022-12-07 DIAGNOSIS — Z13220 Encounter for screening for lipoid disorders: Secondary | ICD-10-CM | POA: Diagnosis not present

## 2022-12-07 DIAGNOSIS — Z0000 Encounter for general adult medical examination without abnormal findings: Secondary | ICD-10-CM | POA: Diagnosis not present

## 2022-12-13 DIAGNOSIS — Z0000 Encounter for general adult medical examination without abnormal findings: Secondary | ICD-10-CM | POA: Diagnosis not present

## 2022-12-13 DIAGNOSIS — Z23 Encounter for immunization: Secondary | ICD-10-CM | POA: Diagnosis not present

## 2023-01-02 DIAGNOSIS — I10 Essential (primary) hypertension: Secondary | ICD-10-CM | POA: Diagnosis not present

## 2023-01-02 DIAGNOSIS — J309 Allergic rhinitis, unspecified: Secondary | ICD-10-CM | POA: Diagnosis not present

## 2023-01-02 DIAGNOSIS — K219 Gastro-esophageal reflux disease without esophagitis: Secondary | ICD-10-CM | POA: Diagnosis not present

## 2023-02-17 DIAGNOSIS — Z23 Encounter for immunization: Secondary | ICD-10-CM | POA: Diagnosis not present

## 2023-03-03 DIAGNOSIS — E782 Mixed hyperlipidemia: Secondary | ICD-10-CM | POA: Diagnosis not present

## 2023-03-03 DIAGNOSIS — I10 Essential (primary) hypertension: Secondary | ICD-10-CM | POA: Diagnosis not present

## 2023-03-10 DIAGNOSIS — E782 Mixed hyperlipidemia: Secondary | ICD-10-CM | POA: Diagnosis not present

## 2023-03-10 DIAGNOSIS — I10 Essential (primary) hypertension: Secondary | ICD-10-CM | POA: Diagnosis not present

## 2023-04-12 DIAGNOSIS — H5213 Myopia, bilateral: Secondary | ICD-10-CM | POA: Diagnosis not present

## 2023-05-08 ENCOUNTER — Other Ambulatory Visit: Payer: Self-pay | Admitting: Obstetrics and Gynecology

## 2023-05-08 DIAGNOSIS — Z1231 Encounter for screening mammogram for malignant neoplasm of breast: Secondary | ICD-10-CM

## 2023-05-09 DIAGNOSIS — U071 COVID-19: Secondary | ICD-10-CM | POA: Diagnosis not present

## 2023-05-09 DIAGNOSIS — J329 Chronic sinusitis, unspecified: Secondary | ICD-10-CM | POA: Diagnosis not present

## 2023-05-09 DIAGNOSIS — J45909 Unspecified asthma, uncomplicated: Secondary | ICD-10-CM | POA: Diagnosis not present

## 2023-05-09 DIAGNOSIS — G43909 Migraine, unspecified, not intractable, without status migrainosus: Secondary | ICD-10-CM | POA: Diagnosis not present

## 2023-05-25 DIAGNOSIS — F411 Generalized anxiety disorder: Secondary | ICD-10-CM | POA: Diagnosis not present

## 2023-05-25 DIAGNOSIS — G4700 Insomnia, unspecified: Secondary | ICD-10-CM | POA: Diagnosis not present

## 2023-05-25 DIAGNOSIS — I10 Essential (primary) hypertension: Secondary | ICD-10-CM | POA: Diagnosis not present

## 2023-05-25 DIAGNOSIS — K219 Gastro-esophageal reflux disease without esophagitis: Secondary | ICD-10-CM | POA: Diagnosis not present

## 2023-06-16 ENCOUNTER — Ambulatory Visit: Admission: RE | Admit: 2023-06-16 | Payer: BC Managed Care – PPO | Source: Ambulatory Visit

## 2023-06-16 DIAGNOSIS — Z1231 Encounter for screening mammogram for malignant neoplasm of breast: Secondary | ICD-10-CM | POA: Diagnosis not present

## 2023-06-22 DIAGNOSIS — F331 Major depressive disorder, recurrent, moderate: Secondary | ICD-10-CM | POA: Diagnosis not present

## 2023-06-22 DIAGNOSIS — F411 Generalized anxiety disorder: Secondary | ICD-10-CM | POA: Diagnosis not present

## 2023-06-22 DIAGNOSIS — Z23 Encounter for immunization: Secondary | ICD-10-CM | POA: Diagnosis not present

## 2023-06-22 DIAGNOSIS — G4700 Insomnia, unspecified: Secondary | ICD-10-CM | POA: Diagnosis not present

## 2023-06-22 DIAGNOSIS — I10 Essential (primary) hypertension: Secondary | ICD-10-CM | POA: Diagnosis not present

## 2023-07-12 DIAGNOSIS — R300 Dysuria: Secondary | ICD-10-CM | POA: Diagnosis not present

## 2023-07-24 DIAGNOSIS — M25619 Stiffness of unspecified shoulder, not elsewhere classified: Secondary | ICD-10-CM | POA: Diagnosis not present

## 2023-07-24 DIAGNOSIS — M9901 Segmental and somatic dysfunction of cervical region: Secondary | ICD-10-CM | POA: Diagnosis not present

## 2023-07-24 DIAGNOSIS — M9902 Segmental and somatic dysfunction of thoracic region: Secondary | ICD-10-CM | POA: Diagnosis not present

## 2023-07-24 DIAGNOSIS — M7912 Myalgia of auxiliary muscles, head and neck: Secondary | ICD-10-CM | POA: Diagnosis not present

## 2023-07-25 DIAGNOSIS — Z23 Encounter for immunization: Secondary | ICD-10-CM | POA: Diagnosis not present

## 2023-07-28 DIAGNOSIS — M9901 Segmental and somatic dysfunction of cervical region: Secondary | ICD-10-CM | POA: Diagnosis not present

## 2023-07-28 DIAGNOSIS — M9902 Segmental and somatic dysfunction of thoracic region: Secondary | ICD-10-CM | POA: Diagnosis not present

## 2023-07-28 DIAGNOSIS — M7912 Myalgia of auxiliary muscles, head and neck: Secondary | ICD-10-CM | POA: Diagnosis not present

## 2023-07-28 DIAGNOSIS — M7918 Myalgia, other site: Secondary | ICD-10-CM | POA: Diagnosis not present

## 2023-07-28 DIAGNOSIS — M25619 Stiffness of unspecified shoulder, not elsewhere classified: Secondary | ICD-10-CM | POA: Diagnosis not present

## 2023-08-03 DIAGNOSIS — M9901 Segmental and somatic dysfunction of cervical region: Secondary | ICD-10-CM | POA: Diagnosis not present

## 2023-08-03 DIAGNOSIS — M7918 Myalgia, other site: Secondary | ICD-10-CM | POA: Diagnosis not present

## 2023-08-03 DIAGNOSIS — M9902 Segmental and somatic dysfunction of thoracic region: Secondary | ICD-10-CM | POA: Diagnosis not present

## 2023-08-03 DIAGNOSIS — M7912 Myalgia of auxiliary muscles, head and neck: Secondary | ICD-10-CM | POA: Diagnosis not present

## 2023-08-03 DIAGNOSIS — M25619 Stiffness of unspecified shoulder, not elsewhere classified: Secondary | ICD-10-CM | POA: Diagnosis not present

## 2023-08-10 DIAGNOSIS — M7912 Myalgia of auxiliary muscles, head and neck: Secondary | ICD-10-CM | POA: Diagnosis not present

## 2023-08-10 DIAGNOSIS — M25619 Stiffness of unspecified shoulder, not elsewhere classified: Secondary | ICD-10-CM | POA: Diagnosis not present

## 2023-08-10 DIAGNOSIS — M9902 Segmental and somatic dysfunction of thoracic region: Secondary | ICD-10-CM | POA: Diagnosis not present

## 2023-08-10 DIAGNOSIS — M7918 Myalgia, other site: Secondary | ICD-10-CM | POA: Diagnosis not present

## 2023-08-10 DIAGNOSIS — M9901 Segmental and somatic dysfunction of cervical region: Secondary | ICD-10-CM | POA: Diagnosis not present

## 2023-08-14 DIAGNOSIS — I10 Essential (primary) hypertension: Secondary | ICD-10-CM | POA: Diagnosis not present

## 2023-08-14 DIAGNOSIS — J309 Allergic rhinitis, unspecified: Secondary | ICD-10-CM | POA: Diagnosis not present

## 2023-08-14 DIAGNOSIS — M25519 Pain in unspecified shoulder: Secondary | ICD-10-CM | POA: Diagnosis not present

## 2023-08-14 DIAGNOSIS — K219 Gastro-esophageal reflux disease without esophagitis: Secondary | ICD-10-CM | POA: Diagnosis not present

## 2023-08-18 DIAGNOSIS — M9902 Segmental and somatic dysfunction of thoracic region: Secondary | ICD-10-CM | POA: Diagnosis not present

## 2023-08-18 DIAGNOSIS — M25619 Stiffness of unspecified shoulder, not elsewhere classified: Secondary | ICD-10-CM | POA: Diagnosis not present

## 2023-08-18 DIAGNOSIS — M7918 Myalgia, other site: Secondary | ICD-10-CM | POA: Diagnosis not present

## 2023-08-18 DIAGNOSIS — M9901 Segmental and somatic dysfunction of cervical region: Secondary | ICD-10-CM | POA: Diagnosis not present

## 2023-08-18 DIAGNOSIS — M7912 Myalgia of auxiliary muscles, head and neck: Secondary | ICD-10-CM | POA: Diagnosis not present

## 2023-08-24 DIAGNOSIS — Z124 Encounter for screening for malignant neoplasm of cervix: Secondary | ICD-10-CM | POA: Diagnosis not present

## 2023-08-24 DIAGNOSIS — Z01419 Encounter for gynecological examination (general) (routine) without abnormal findings: Secondary | ICD-10-CM | POA: Diagnosis not present

## 2023-08-24 DIAGNOSIS — F419 Anxiety disorder, unspecified: Secondary | ICD-10-CM | POA: Diagnosis not present

## 2023-08-24 DIAGNOSIS — Z6824 Body mass index (BMI) 24.0-24.9, adult: Secondary | ICD-10-CM | POA: Diagnosis not present

## 2023-08-25 DIAGNOSIS — M7912 Myalgia of auxiliary muscles, head and neck: Secondary | ICD-10-CM | POA: Diagnosis not present

## 2023-08-25 DIAGNOSIS — M9902 Segmental and somatic dysfunction of thoracic region: Secondary | ICD-10-CM | POA: Diagnosis not present

## 2023-08-25 DIAGNOSIS — M25619 Stiffness of unspecified shoulder, not elsewhere classified: Secondary | ICD-10-CM | POA: Diagnosis not present

## 2023-08-25 DIAGNOSIS — M7918 Myalgia, other site: Secondary | ICD-10-CM | POA: Diagnosis not present

## 2023-08-25 DIAGNOSIS — M9901 Segmental and somatic dysfunction of cervical region: Secondary | ICD-10-CM | POA: Diagnosis not present

## 2023-09-05 DIAGNOSIS — I10 Essential (primary) hypertension: Secondary | ICD-10-CM | POA: Diagnosis not present

## 2023-09-05 DIAGNOSIS — F411 Generalized anxiety disorder: Secondary | ICD-10-CM | POA: Diagnosis not present

## 2023-09-05 DIAGNOSIS — F410 Panic disorder [episodic paroxysmal anxiety] without agoraphobia: Secondary | ICD-10-CM | POA: Diagnosis not present

## 2023-09-05 DIAGNOSIS — K219 Gastro-esophageal reflux disease without esophagitis: Secondary | ICD-10-CM | POA: Diagnosis not present

## 2023-11-06 DIAGNOSIS — I10 Essential (primary) hypertension: Secondary | ICD-10-CM | POA: Diagnosis not present

## 2023-11-06 DIAGNOSIS — F411 Generalized anxiety disorder: Secondary | ICD-10-CM | POA: Diagnosis not present

## 2023-11-06 DIAGNOSIS — G4700 Insomnia, unspecified: Secondary | ICD-10-CM | POA: Diagnosis not present

## 2023-11-06 DIAGNOSIS — K219 Gastro-esophageal reflux disease without esophagitis: Secondary | ICD-10-CM | POA: Diagnosis not present

## 2023-12-12 DIAGNOSIS — Z13220 Encounter for screening for lipoid disorders: Secondary | ICD-10-CM | POA: Diagnosis not present

## 2023-12-12 DIAGNOSIS — Z0000 Encounter for general adult medical examination without abnormal findings: Secondary | ICD-10-CM | POA: Diagnosis not present

## 2023-12-14 DIAGNOSIS — J069 Acute upper respiratory infection, unspecified: Secondary | ICD-10-CM | POA: Diagnosis not present

## 2023-12-14 DIAGNOSIS — Z1159 Encounter for screening for other viral diseases: Secondary | ICD-10-CM | POA: Diagnosis not present

## 2023-12-17 DIAGNOSIS — J329 Chronic sinusitis, unspecified: Secondary | ICD-10-CM | POA: Diagnosis not present

## 2023-12-17 DIAGNOSIS — B9689 Other specified bacterial agents as the cause of diseases classified elsewhere: Secondary | ICD-10-CM | POA: Diagnosis not present

## 2023-12-19 DIAGNOSIS — Z0000 Encounter for general adult medical examination without abnormal findings: Secondary | ICD-10-CM | POA: Diagnosis not present

## 2024-02-22 DIAGNOSIS — D225 Melanocytic nevi of trunk: Secondary | ICD-10-CM | POA: Diagnosis not present

## 2024-02-22 DIAGNOSIS — D2372 Other benign neoplasm of skin of left lower limb, including hip: Secondary | ICD-10-CM | POA: Diagnosis not present

## 2024-02-22 DIAGNOSIS — D2261 Melanocytic nevi of right upper limb, including shoulder: Secondary | ICD-10-CM | POA: Diagnosis not present

## 2024-02-22 DIAGNOSIS — B078 Other viral warts: Secondary | ICD-10-CM | POA: Diagnosis not present

## 2024-02-22 DIAGNOSIS — D2262 Melanocytic nevi of left upper limb, including shoulder: Secondary | ICD-10-CM | POA: Diagnosis not present

## 2024-04-12 DIAGNOSIS — Z113 Encounter for screening for infections with a predominantly sexual mode of transmission: Secondary | ICD-10-CM | POA: Diagnosis not present

## 2024-04-12 DIAGNOSIS — N760 Acute vaginitis: Secondary | ICD-10-CM | POA: Diagnosis not present

## 2024-05-02 DIAGNOSIS — N926 Irregular menstruation, unspecified: Secondary | ICD-10-CM | POA: Diagnosis not present

## 2024-05-02 DIAGNOSIS — N898 Other specified noninflammatory disorders of vagina: Secondary | ICD-10-CM | POA: Diagnosis not present

## 2024-05-06 ENCOUNTER — Other Ambulatory Visit: Payer: Self-pay | Admitting: Obstetrics and Gynecology

## 2024-05-06 DIAGNOSIS — Z1231 Encounter for screening mammogram for malignant neoplasm of breast: Secondary | ICD-10-CM

## 2024-06-18 ENCOUNTER — Ambulatory Visit
Admission: RE | Admit: 2024-06-18 | Discharge: 2024-06-18 | Disposition: A | Discharge status: Home / Self Care | Source: Ambulatory Visit | Attending: Obstetrics and Gynecology | Admitting: Obstetrics and Gynecology

## 2024-06-18 DIAGNOSIS — Z1231 Encounter for screening mammogram for malignant neoplasm of breast: Secondary | ICD-10-CM

## 2024-07-05 DIAGNOSIS — H5213 Myopia, bilateral: Secondary | ICD-10-CM | POA: Diagnosis not present

## 2024-07-12 DIAGNOSIS — J329 Chronic sinusitis, unspecified: Secondary | ICD-10-CM | POA: Diagnosis not present

## 2024-07-12 DIAGNOSIS — J069 Acute upper respiratory infection, unspecified: Secondary | ICD-10-CM | POA: Diagnosis not present

## 2024-07-12 DIAGNOSIS — B9689 Other specified bacterial agents as the cause of diseases classified elsewhere: Secondary | ICD-10-CM | POA: Diagnosis not present

## 2024-07-12 DIAGNOSIS — J45909 Unspecified asthma, uncomplicated: Secondary | ICD-10-CM | POA: Diagnosis not present

## 2024-07-29 DIAGNOSIS — E782 Mixed hyperlipidemia: Secondary | ICD-10-CM | POA: Diagnosis not present

## 2024-07-29 DIAGNOSIS — I10 Essential (primary) hypertension: Secondary | ICD-10-CM | POA: Diagnosis not present

## 2024-08-05 DIAGNOSIS — G4700 Insomnia, unspecified: Secondary | ICD-10-CM | POA: Diagnosis not present

## 2024-08-05 DIAGNOSIS — Z789 Other specified health status: Secondary | ICD-10-CM | POA: Diagnosis not present

## 2024-08-05 DIAGNOSIS — Z7185 Encounter for immunization safety counseling: Secondary | ICD-10-CM | POA: Diagnosis not present

## 2024-08-05 DIAGNOSIS — Z23 Encounter for immunization: Secondary | ICD-10-CM | POA: Diagnosis not present

## 2024-08-05 DIAGNOSIS — E782 Mixed hyperlipidemia: Secondary | ICD-10-CM | POA: Diagnosis not present

## 2024-08-05 DIAGNOSIS — I10 Essential (primary) hypertension: Secondary | ICD-10-CM | POA: Diagnosis not present

## 2024-08-26 DIAGNOSIS — Z124 Encounter for screening for malignant neoplasm of cervix: Secondary | ICD-10-CM | POA: Diagnosis not present

## 2024-08-26 DIAGNOSIS — Z01419 Encounter for gynecological examination (general) (routine) without abnormal findings: Secondary | ICD-10-CM | POA: Diagnosis not present

## 2024-08-26 DIAGNOSIS — Z6826 Body mass index (BMI) 26.0-26.9, adult: Secondary | ICD-10-CM | POA: Diagnosis not present

## 2024-08-26 DIAGNOSIS — Z1151 Encounter for screening for human papillomavirus (HPV): Secondary | ICD-10-CM | POA: Diagnosis not present
